# Patient Record
Sex: Female | Born: 1965 | Race: White | Hispanic: No | Smoking: Former smoker
Health system: Southern US, Community
[De-identification: ages and names within clinical notes are randomized; demographics above are authoritative.]

## PROBLEM LIST (undated history)

## (undated) DIAGNOSIS — F419 Anxiety disorder, unspecified: Secondary | ICD-10-CM

## (undated) DIAGNOSIS — M1990 Unspecified osteoarthritis, unspecified site: Secondary | ICD-10-CM

## (undated) DIAGNOSIS — Z8719 Personal history of other diseases of the digestive system: Secondary | ICD-10-CM

## (undated) DIAGNOSIS — K219 Gastro-esophageal reflux disease without esophagitis: Secondary | ICD-10-CM

## (undated) DIAGNOSIS — G4730 Sleep apnea, unspecified: Secondary | ICD-10-CM

## (undated) DIAGNOSIS — M797 Fibromyalgia: Secondary | ICD-10-CM

## (undated) HISTORY — PX: BREAST LUMPECTOMY: SHX2

## (undated) HISTORY — PX: TONSILLECTOMY: SUR1361

## (undated) HISTORY — PX: BILATERAL CARPAL TUNNEL RELEASE: SHX6508

## (undated) HISTORY — PX: SMALL INTESTINE SURGERY: SHX150

## (undated) HISTORY — PX: APPENDECTOMY: SHX54

---

## 2021-02-13 NOTE — Progress Notes (Signed)
DUE TO COVID-19 ONLY ONE VISITOR IS ALLOWED TO COME WITH YOU AND STAY IN THE WAITING ROOM ONLY DURING PRE OP AND PROCEDURE DAY OF SURGERY. THE 1 VISITOR  MAY VISIT WITH YOU AFTER SURGERY IN YOUR PRIVATE ROOM DURING VISITING HOURS ONLY!  YOU NEED TO HAVE A COVID 19 TEST ON__5/27/2022 _____ @_______ , THIS TEST MUST BE DONE BEFORE SURGERY,  COVID TESTING SITE [address] [place] , IT IS ON THE RIGHT GOING OUT [address] APPROXIMATELY  2 MINUTES PAST [employer] ON THE RIGHT. ONCE YOUR COVID TEST IS COMPLETED,  PLEASE BEGIN THE QUARANTINE INSTRUCTIONS AS OUTLINED IN YOUR HANDOUT.                Tabitha Robertson  02/13/2021   Your procedure is scheduled on:  02/26/21  Report to [facility] Main  Entrance   Report to admitting at    0610am      Call this number if you have problems the morning of surgery [phone]    REMEMBER: NO  SOLID FOOD CANDY OR GUM AFTER MIDNIGHT. CLEAR LIQUIDS UNTIL      0540am      . NOTHING BY MOUTH EXCEPT CLEAR LIQUIDS UNTIL      0540am   . PLEASE FINISH ENSURE DRINK PER SURGEON ORDER  WHICH NEEDS TO BE COMPLETED AT   0540am      .      CLEAR LIQUID DIET   Foods Allowed                                                                    Coffee and tea, regular and decaf                            Fruit ices (not with fruit pulp)                                      Iced Popsicles                                    Carbonated beverages, regular and diet                                    Cranberry, grape and apple juices Sports drinks like Gatorade Lightly seasoned clear broth or consume(fat free) Sugar, honey syrup ___________________________________________________________________      BRUSH YOUR TEETH MORNING OF SURGERY AND RINSE YOUR MOUTH OUT, NO CHEWING GUM CANDY OR MINTS.     Take these medicines the morning of surgery with A SIP OF WATER:       None  DO NOT TAKE ANY DIABETIC MEDICATIONS DAY OF YOUR  SURGERY                               You may not have any metal on your body including hair pins and  piercings  Do not wear jewelry, make-up, lotions, powders or perfumes, deodorant             Do not wear nail polish on your fingernails.  Do not shave  48 hours prior to surgery.              Men may shave face and neck.   Do not bring valuables to the hospital. [place].  Contacts, dentures or bridgework may not be worn into surgery.  Leave suitcase in the car. After surgery it may be brought to your room.     Patients discharged the day of surgery will not be allowed to drive home. IF YOU ARE HAVING SURGERY AND GOING HOME THE SAME DAY, YOU MUST HAVE AN ADULT TO DRIVE YOU HOME AND BE WITH YOU FOR 24 HOURS. YOU MAY GO HOME BY TAXI OR UBER OR ORTHERWISE, BUT AN ADULT MUST ACCOMPANY YOU HOME AND STAY WITH YOU FOR 24 HOURS.  Name and phone number of your driver:  Special Instructions: N/A              Please read over the following fact sheets you were given: _____________________________________________________________________  [facility] - Preparing for Surgery Before surgery, you can play an important role.  Because skin is not sterile, your skin needs to be as free of germs as possible.  You can reduce the number of germs on your skin by washing with CHG (chlorahexidine gluconate) soap before surgery.  CHG is an antiseptic cleaner which kills germs and bonds with the skin to continue killing germs even after washing. Please DO NOT use if you have an allergy to CHG or antibacterial soaps.  If your skin becomes reddened/irritated stop using the CHG and inform your nurse when you arrive at Short Stay. Do not shave (including legs and underarms) for at least 48 hours prior to the first CHG shower.  You may shave your face/neck. Please follow these instructions carefully:  1.  Shower with CHG Soap the night before surgery and the   morning of Surgery.  2.  If you choose to wash your hair, wash your hair first as usual with your  normal  shampoo.  3.  After you shampoo, rinse your hair and body thoroughly to remove the  shampoo.                           4.  Use CHG as you would any other liquid soap.  You can apply chg directly  to the skin and wash                       Gently with a scrungie or clean washcloth.  5.  Apply the CHG Soap to your body ONLY FROM THE NECK DOWN.   Do not use on face/ open                           Wound or open sores. Avoid contact with eyes, ears mouth and genitals (private parts).                       Wash face,  Genitals (private parts) with your normal soap.             6.  Wash thoroughly, paying special attention to the area where your surgery  will be performed.  7.  Thoroughly rinse your body with warm water from the neck down.  8.  DO NOT shower/wash with your normal soap after using and rinsing off  the CHG Soap.                9.  Pat yourself dry with a clean towel.            10.  Wear clean pajamas.            11.  Place clean sheets on your bed the night of your first shower and do not  sleep with pets. Day of Surgery : Do not apply any lotions/deodorants the morning of surgery.  Please wear clean clothes to the hospital/surgery center.  FAILURE TO FOLLOW THESE INSTRUCTIONS MAY RESULT IN THE CANCELLATION OF YOUR SURGERY PATIENT SIGNATURE_________________________________  NURSE SIGNATURE__________________________________  ________________________________________________________________________

## 2021-02-15 ENCOUNTER — Other Ambulatory Visit: Payer: Self-pay

## 2021-02-15 ENCOUNTER — Encounter (INDEPENDENT_AMBULATORY_CARE_PROVIDER_SITE_OTHER): Payer: Self-pay

## 2021-02-15 ENCOUNTER — Encounter (HOSPITAL_COMMUNITY)
Admission: RE | Admit: 2021-02-15 | Discharge: 2021-02-15 | Disposition: A | Discharge status: Home / Self Care | Payer: BC Managed Care – PPO | Source: Ambulatory Visit | Attending: Orthopedic Surgery | Admitting: Orthopedic Surgery

## 2021-02-15 ENCOUNTER — Encounter (HOSPITAL_COMMUNITY): Payer: Self-pay

## 2021-02-15 DIAGNOSIS — Z01812 Encounter for preprocedural laboratory examination: Secondary | ICD-10-CM | POA: Diagnosis not present

## 2021-02-15 HISTORY — DX: Sleep apnea, unspecified: G47.30

## 2021-02-15 HISTORY — DX: Anxiety disorder, unspecified: F41.9

## 2021-02-15 HISTORY — DX: Unspecified osteoarthritis, unspecified site: M19.90

## 2021-02-15 HISTORY — DX: Gastro-esophageal reflux disease without esophagitis: K21.9

## 2021-02-15 HISTORY — DX: Fibromyalgia: M79.7

## 2021-02-15 HISTORY — DX: Personal history of other diseases of the digestive system: Z87.19

## 2021-02-15 LAB — SURGICAL PCR SCREEN
MRSA, PCR: NEGATIVE
Staphylococcus aureus: NEGATIVE

## 2021-02-15 LAB — PROTIME-INR
INR: 0.9 (ref 0.8–1.2)
Prothrombin Time: 12.3 seconds (ref 11.4–15.2)

## 2021-02-15 LAB — COMPREHENSIVE METABOLIC PANEL
ALT: 14 U/L (ref 0–44)
AST: 18 U/L (ref 15–41)
Albumin: 4 g/dL (ref 3.5–5.0)
Alkaline Phosphatase: 178 U/L — ABNORMAL HIGH (ref 38–126)
Anion gap: 9 (ref 5–15)
BUN: 13 mg/dL (ref 6–20)
CO2: 23 mmol/L (ref 22–32)
Calcium: 8.7 mg/dL — ABNORMAL LOW (ref 8.9–10.3)
Chloride: 105 mmol/L (ref 98–111)
Creatinine, Ser: 0.87 mg/dL (ref 0.44–1.00)
GFR, Estimated: >60 mL/min (ref >60)
Glucose, Bld: 95 mg/dL (ref 70–99)
Potassium: 3.8 mmol/L (ref 3.5–5.1)
Sodium: 137 mmol/L (ref 135–145)
Total Bilirubin: 0.4 mg/dL (ref 0.3–1.2)
Total Protein: 7.2 g/dL (ref 6.5–8.1)

## 2021-02-15 LAB — TYPE AND SCREEN
ABO/RH(D): O NEG
Antibody Screen: NEGATIVE

## 2021-02-15 LAB — CBC
HCT: 39.8 % (ref 36.0–46.0)
Hemoglobin: 13.1 g/dL (ref 12.0–15.0)
MCH: 30.3 pg (ref 26.0–34.0)
MCHC: 32.9 g/dL (ref 30.0–36.0)
MCV: 91.9 fL (ref 80.0–100.0)
Platelets: 263 10*3/uL (ref 150–400)
RBC: 4.33 MIL/uL (ref 3.87–5.11)
RDW: 13.5 % (ref 11.5–15.5)
WBC: 6.5 10*3/uL (ref 4.0–10.5)
nRBC: 0 % (ref 0.0–0.2)

## 2021-02-15 LAB — APTT: aPTT: 35 seconds (ref 24–36)

## 2021-02-15 NOTE — Progress Notes (Addendum)
Anesthesia Review:  PCP: DR Imagene Sheller- They are to fax preop visit from 02/14/2021 and ekg.  02/14/2021 ov on chart  Cardiologist :none  Chest x-ray : EKG : 02/14/21 on chart  Echo : Stress test: Cardiac Cath :  Activity level: can do a flight of stairs without difficulty  Sleep Study/ CPAP :has not used in 10 years  Fasting Blood Sugar :      / Checks Blood Sugar -- times a day:   Blood Thinner/ Instructions /Last Dose: ASA / Instructions/ Last Dose :  CMp done 02/15/21 routed to Dr Charlann Boxer.

## 2021-02-15 NOTE — Progress Notes (Signed)
Vital signs done today at 1142 am were done in error.

## 2021-02-22 ENCOUNTER — Other Ambulatory Visit (HOSPITAL_COMMUNITY): Payer: BC Managed Care – PPO

## 2021-02-26 ENCOUNTER — Ambulatory Visit (HOSPITAL_COMMUNITY)
Admission: RE | Admit: 2021-02-26 | Payer: BC Managed Care – PPO | Source: Home / Self Care | Admitting: Orthopedic Surgery

## 2021-02-26 ENCOUNTER — Encounter (HOSPITAL_COMMUNITY): Admission: RE | Payer: Self-pay | Source: Home / Self Care

## 2021-02-26 SURGERY — ARTHROPLASTY, HIP, TOTAL, ANTERIOR APPROACH
Anesthesia: Spinal | Site: Hip | Laterality: Right

## 2021-03-28 NOTE — Progress Notes (Addendum)
PCP - Clearance Imagene Sheller 02-14-21 epic Cardiologist -   PPM/ICD -  Device Orders -  Rep Notified -   Chest x-ray -  EKG - on chart 02-14-21 Stress Test -  ECHO -  Cardiac Cath -   Sleep Study -  CPAP - no  Fasting Blood Sugar -  Checks Blood Sugar _____ times a day  Blood Thinner Instructions: Aspirin Instructions:  ERAS Protcol - PRE-SURGERY Ensure or G2-   COVID TEST- 7-8  Activity---Works around home without SOB Anesthesia review: osa no cpap  Patient denies shortness of breath, fever, cough and chest pain at PAT appointment   All instructions explained to the patient, with a verbal understanding of the material. Patient agrees to go over the instructions while at home for a better understanding. Patient also instructed to self quarantine after being tested for COVID-19. The opportunity to ask questions was provided.

## 2021-03-28 NOTE — Patient Instructions (Addendum)
DUE TO COVID-19 ONLY ONE VISITOR IS ALLOWED TO COME WITH YOU AND STAY IN THE WAITING ROOM ONLY DURING PRE OP AND PROCEDURE DAY OF SURGERY.   TWO VISITOR  MAY VISIT WITH YOU AFTER SURGERY IN YOUR PRIVATE ROOM DURING VISITING HOURS ONLY!  YOU NEED TO HAVE A COVID 19 TEST ON___7-8-22____ @_______ , THIS TEST MUST BE DONE BEFORE SURGERY,    COVID TESTING SITE [address] [place] ,   IT IS ON THE RIGHT GOING OUT [address] APPROXIMATELY  2 MINUTES PAST [employer] ON THE RIGHT. ONCE YOUR COVID TEST IS COMPLETED,  PLEASE Wear a mask when in public           Your procedure is scheduled on: 04-09-21   Report to [facility] Main  Entrance   Report to admitting at     0900 AM     Call this number if you have problems the morning of surgery [phone]    Remember: NO SOLID FOOD AFTER MIDNIGHT THE NIGHT PRIOR TO SURGERY. NOTHING BY MOUTH EXCEPT CLEAR LIQUIDS UNTIL 0830 am .   PLEASE FINISH ENSURE DRINK PER SURGEON ORDER  WHICH NEEDS TO BE COMPLETED AT        0830 am then nothing by mouth.      CLEAR LIQUID DIET   Foods Allowed                                                                     Foods Excluded water Black Coffee and tea, regular and decaf                             liquids that you cannot  Plain Jell-O any favor except red or purple                                           see through such as: Fruit ices (not with fruit pulp)                                                   milk, soups, orange juice  Iced Popsicles                                                         All solid food Carbonated beverages, regular and diet                                    Cranberry, grape and apple juices Sports drinks like Gatorade Lightly seasoned clear broth or consume(fat free) Sugar, honey syrup  _____________________________________________________________________     BRUSH YOUR TEETH MORNING OF SURGERY AND RINSE YOUR MOUTH OUT,  NO CHEWING GUM CANDY  OR MINTS.     Take these medicines the morning of surgery with A SIP OF WATER: none  DO NOT TAKE ANY DIABETIC MEDICATIONS DAY OF YOUR SURGERY                               You may not have any metal on your body including hair pins and              piercings  Do not wear jewelry, make-up, lotions, powders or perfumes, deodorant             Do not wear nail polish on your fingernails or toenails .  Do not shave  48 hours prior to surgery.         Do not bring valuables to the hospital. [facility] IS NOT             RESPONSIBLE   FOR VALUABLES.  Contacts, dentures or bridgework may not be worn into surgery.       Patients discharged the day of surgery will not be allowed to drive home. IF YOU ARE HAVING SURGERY AND GOING HOME THE SAME DAY, YOU MUST HAVE AN ADULT TO DRIVE YOU HOME AND BE WITH YOU FOR 24 HOURS. YOU MAY GO HOME BY TAXI OR UBER OR ORTHERWISE, BUT AN ADULT MUST ACCOMPANY YOU HOME AND STAY WITH YOU FOR 24 HOURS.  Name and phone number of your driver:  Special Instructions: N/A              Please read over the following fact sheets you were given: _____________________________________________________________________             [facility] - Preparing for Surgery Before surgery, you can play an important role.  Because skin is not sterile, your skin needs to be as free of germs as possible.  You can reduce the number of germs on your skin by washing with CHG (chlorahexidine gluconate) soap before surgery.  CHG is an antiseptic cleaner which kills germs and bonds with the skin to continue killing germs even after washing. Please DO NOT use if you have an allergy to CHG or antibacterial soaps.  If your skin becomes reddened/irritated stop using the CHG and inform your nurse when you arrive at Short Stay. Do not shave (including legs and underarms) for at least 48 hours prior to the first CHG shower.  You may shave your face/neck. Please follow these  instructions carefully:  1.  Shower with CHG Soap the night before surgery and the  morning of Surgery.  2.  If you choose to wash your hair, wash your hair first as usual with your  normal  shampoo.  3.  After you shampoo, rinse your hair and body thoroughly to remove the  shampoo.                           4.  Use CHG as you would any other liquid soap.  You can apply chg directly  to the skin and wash                       Gently with a scrungie or clean washcloth.  5.  Apply the CHG Soap to your body ONLY FROM THE NECK DOWN.   Do not use on face/ open  Wound or open sores. Avoid contact with eyes, ears mouth and genitals (private parts).                       Wash face,  Genitals (private parts) with your normal soap.             6.  Wash thoroughly, paying special attention to the area where your surgery  will be performed.  7.  Thoroughly rinse your body with warm water from the neck down.  8.  DO NOT shower/wash with your normal soap after using and rinsing off  the CHG Soap.                9.  Pat yourself dry with a clean towel.            10.  Wear clean pajamas.            11.  Place clean sheets on your bed the night of your first shower and do not  sleep with pets. Day of Surgery : Do not apply any lotions/deodorants the morning of surgery.  Please wear clean clothes to the hospital/surgery center.  FAILURE TO FOLLOW THESE INSTRUCTIONS MAY RESULT IN THE CANCELLATION OF YOUR SURGERY PATIENT SIGNATURE_________________________________  NURSE SIGNATURE__________________________________   Incentive Spirometer  An incentive spirometer is a tool that can help keep your lungs clear and active. This tool measures how well you are filling your lungs with each breath. Taking long deep breaths may help reverse or decrease the chance of developing breathing (pulmonary) problems (especially infection) following: A long period of time when you are unable to move or be  active. BEFORE THE PROCEDURE  If the spirometer includes an indicator to show your best effort, your nurse or respiratory therapist will set it to a desired goal. If possible, sit up straight or lean slightly forward. Try not to slouch. Hold the incentive spirometer in an upright position. INSTRUCTIONS FOR USE  Sit on the edge of your bed if possible, or sit up as far as you can in bed or on a chair. Hold the incentive spirometer in an upright position. Breathe out normally. Place the mouthpiece in your mouth and seal your lips tightly around it. Breathe in slowly and as deeply as possible, raising the piston or the ball toward the top of the column. Hold your breath for 3-5 seconds or for as long as possible. Allow the piston or ball to fall to the bottom of the column. Remove the mouthpiece from your mouth and breathe out normally. Rest for a few seconds and repeat Steps 1 through 7 at least 10 times every 1-2 hours when you are awake. Take your time and take a few normal breaths between deep breaths. The spirometer may include an indicator to show your best effort. Use the indicator as a goal to work toward during each repetition. After each set of 10 deep breaths, practice coughing to be sure your lungs are clear. If you have an incision (the cut made at the time of surgery), support your incision when coughing by placing a pillow or rolled up towels firmly against it. Once you are able to get out of bed, walk around indoors and cough well. You may stop using the incentive spirometer when instructed by your caregiver.  RISKS AND COMPLICATIONS Take your time so you do not get dizzy or light-headed. If you are in pain, you may need to take or ask for pain  medication before doing incentive spirometry. It is harder to take a deep breath if you are having pain. AFTER USE Rest and breathe slowly and easily. It can be helpful to keep track of a log of your progress. Your caregiver can provide you  with a simple table to help with this. If you are using the spirometer at home, follow these instructions: [place] IF:  You are having difficultly using the spirometer. You have trouble using the spirometer as often as instructed. Your pain medication is not giving enough relief while using the spirometer. You develop fever of 100.5 F (38.1 C) or higher. SEEK IMMEDIATE MEDICAL CARE IF:  You cough up bloody sputum that had not been present before. You develop fever of 102 F (38.9 C) or greater. You develop worsening pain at or near the incision site. MAKE SURE YOU:  Understand these instructions. Will watch your condition. Will get help right away if you are not doing well or get worse. Document Released: 01/26/2007 Document Revised: 12/08/2011 Document Reviewed: 03/29/2007 [facility] Patient Information 2014 [employer], [place].   ________________________________________________________________________  ________________________________________________________________________

## 2021-03-29 ENCOUNTER — Encounter (HOSPITAL_COMMUNITY)
Admission: RE | Admit: 2021-03-29 | Discharge: 2021-03-29 | Disposition: A | Discharge status: Home / Self Care | Payer: BC Managed Care – PPO | Source: Ambulatory Visit | Attending: Orthopedic Surgery | Admitting: Orthopedic Surgery

## 2021-03-29 ENCOUNTER — Other Ambulatory Visit: Payer: Self-pay

## 2021-03-29 ENCOUNTER — Encounter (HOSPITAL_COMMUNITY): Payer: Self-pay

## 2021-03-29 DIAGNOSIS — Z01812 Encounter for preprocedural laboratory examination: Secondary | ICD-10-CM | POA: Insufficient documentation

## 2021-03-29 LAB — COMPREHENSIVE METABOLIC PANEL
ALT: 13 U/L (ref 0–44)
AST: 18 U/L (ref 15–41)
Albumin: 3.8 g/dL (ref 3.5–5.0)
Alkaline Phosphatase: 169 U/L — ABNORMAL HIGH (ref 38–126)
Anion gap: 6 (ref 5–15)
BUN: 11 mg/dL (ref 6–20)
CO2: 25 mmol/L (ref 22–32)
Calcium: 8.9 mg/dL (ref 8.9–10.3)
Chloride: 105 mmol/L (ref 98–111)
Creatinine, Ser: 0.76 mg/dL (ref 0.44–1.00)
GFR, Estimated: >60 mL/min (ref >60)
Glucose, Bld: 99 mg/dL (ref 70–99)
Potassium: 4.1 mmol/L (ref 3.5–5.1)
Sodium: 136 mmol/L (ref 135–145)
Total Bilirubin: 0.5 mg/dL (ref 0.3–1.2)
Total Protein: 7.1 g/dL (ref 6.5–8.1)

## 2021-03-29 LAB — CBC
HCT: 38.5 % (ref 36.0–46.0)
Hemoglobin: 12.3 g/dL (ref 12.0–15.0)
MCH: 29.2 pg (ref 26.0–34.0)
MCHC: 31.9 g/dL (ref 30.0–36.0)
MCV: 91.4 fL (ref 80.0–100.0)
Platelets: 282 10*3/uL (ref 150–400)
RBC: 4.21 MIL/uL (ref 3.87–5.11)
RDW: 14.2 % (ref 11.5–15.5)
WBC: 8.9 10*3/uL (ref 4.0–10.5)
nRBC: 0 % (ref 0.0–0.2)

## 2021-03-29 LAB — PROTIME-INR
INR: 0.9 (ref 0.8–1.2)
Prothrombin Time: 12.2 seconds (ref 11.4–15.2)

## 2021-03-29 LAB — TYPE AND SCREEN
ABO/RH(D): O NEG
Antibody Screen: NEGATIVE

## 2021-03-29 LAB — APTT: aPTT: 34 seconds (ref 24–36)

## 2021-03-30 LAB — SURGICAL PCR SCREEN
MRSA, PCR: NEGATIVE
Staphylococcus aureus: POSITIVE — AB

## 2021-04-02 NOTE — H&P (Signed)
TOTAL HIP ADMISSION H&P  Patient is admitted for right total hip arthroplasty.  Subjective:  Chief Complaint: right hip pain  HPI: Tabitha Robertson, 55 y.o. female, has a history of pain and functional disability in the right hip(s) due to arthritis and patient has failed non-surgical conservative treatments for greater than 12 weeks to include NSAID's and/or analgesics, corticosteriod injections, and activity modification.  Onset of symptoms was gradual starting 3 years ago with gradually worsening course since that time.The patient noted no past surgery on the right hip(s).  Patient currently rates pain in the right hip at 8 out of 10 with activity. Patient has worsening of pain with activity and weight bearing and pain that interfers with activities of daily living. Patient has evidence of joint space narrowing by imaging studies. This condition presents safety issues increasing the risk of falls. There is no current active infection.  There are no problems to display for this patient.  Past Medical History:  Diagnosis Date   Anxiety    Arthritis    Fibromyalgia    GERD (gastroesophageal reflux disease)    History of hiatal hernia    Sleep apnea    does not use cpap not used in 10 years     Past Surgical History:  Procedure Laterality Date   APPENDECTOMY     BILATERAL CARPAL TUNNEL RELEASE     BREAST LUMPECTOMY     CESAREAN SECTION     x 3   SMALL INTESTINE SURGERY     TONSILLECTOMY     and adenoidectomy     No current facility-administered medications for this encounter.   Current Outpatient Medications  Medication Sig Dispense Refill Last Dose   acetaminophen (TYLENOL) 500 MG tablet Take 1,000 mg by mouth 2 (two) times daily as needed for moderate pain.      ALPHA LIPOIC ACID PO Take 1 capsule by mouth daily.      amitriptyline (ELAVIL) 50 MG tablet Take 50 mg by mouth daily.      Ascorbic Acid (VITAMIN C PO) Take 1 tablet by mouth daily.      b complex vitamins  capsule Take 1 capsule by mouth daily.      Choline Bitartrate (CHOLINE PO) Take 1 tablet by mouth 3 (three) times daily with meals.      Cyanocobalamin (B-12 PO) Take 1 capsule by mouth daily.      diclofenac Sodium (VOLTAREN) 1 % GEL Apply 1 application topically 4 (four) times daily as needed (pain).      ibuprofen (ADVIL) 200 MG tablet Take 400 mg by mouth 2 (two) times daily as needed for moderate pain or headache.      LUTEIN PO Take 1 capsule by mouth daily.      MAGNESIUM PO Take 1 tablet by mouth daily.      methocarbamol (ROBAXIN) 500 MG tablet Take 500 mg by mouth 3 (three) times daily.      Multiple Vitamins-Minerals (ZINC PO) Take 1 tablet by mouth daily.      polyethylene glycol (MIRALAX / GLYCOLAX) 17 g packet Take 17 g by mouth daily.      POTASSIUM PO Take 1 tablet by mouth daily.      TURMERIC PO Take 1 tablet by mouth daily.      VITAMIN A PO Take 1 capsule by mouth daily.      VITAMIN D PO Take 3 capsules by mouth daily.      VITAMIN E PO Take 1  capsule by mouth daily.      traMADol (ULTRAM) 50 MG tablet Take 100 mg by mouth daily as needed.      No Known Allergies  Social History   Tobacco Use   Smoking status: Former    Pack years: 0.00    Types: Cigarettes    Quit date: 09/29/2017    Years since quitting: 3.5   Smokeless tobacco: Never  Substance Use Topics   Alcohol use: Not Currently    Comment: rare    No family history on file.   Review of Systems  Constitutional:  Negative for chills and fever.  Respiratory:  Negative for cough and shortness of breath.   Cardiovascular:  Negative for chest pain.  Gastrointestinal:  Negative for nausea and vomiting.  Musculoskeletal:  Positive for arthralgias.    Objective:  Physical Exam Well nourished and well developed. General: Alert and oriented x3, cooperative and pleasant, no acute distress. Head: normocephalic, atraumatic, neck supple. Eyes: EOMI.  Musculoskeletal: Right hip exam: Painful and very  limited right hip range of motion today. She has pain with active hip flexion with external rotation noted. Passive range of motion is pain-free with hip flexion over 90 degrees with internal rotation to less than 5 degrees before pelvic tilting and pain. She is neurovascular intact distally without lower extremity edema or erythema  Calves soft and nontender. Motor function intact in LE. Strength 5/5 LE bilaterally. Neuro: Distal pulses 2+. Sensation to light touch intact in LE.  Vital signs in last 24 hours:    Labs:   Estimated body mass index is 35.67 kg/m as calculated from the following:   Height as of 03/29/21: 5\' 6"  (1.676 m).   Weight as of 03/29/21: 100.2 kg.   Imaging Review Plain radiographs demonstrate severe degenerative joint disease of the right hip(s). The bone quality appears to be adequate for age and reported activity level.    Assessment/Plan:  End stage arthritis, right hip(s)  The patient history, physical examination, clinical judgement of the provider and imaging studies are consistent with end stage degenerative joint disease of the right hip(s) and total hip arthroplasty is deemed medically necessary. The treatment options including medical management, injection therapy, arthroscopy and arthroplasty were discussed at length. The risks and benefits of total hip arthroplasty were presented and reviewed. The risks due to aseptic loosening, infection, stiffness, dislocation/subluxation,  thromboembolic complications and other imponderables were discussed.  The patient acknowledged the explanation, agreed to proceed with the plan and consent was signed. Patient is being admitted for inpatient treatment for surgery, pain control, PT, OT, prophylactic antibiotics, VTE prophylaxis, progressive ambulation and ADL's and discharge planning.The patient is planning to be discharged  home.  Therapy Plans: HEP Disposition: Home with mother & stepdad Planned DVT Prophylaxis:  aspirin 81mg  BID DME needed: walker PCP: Dr. 05/30/21, clearance received TXA: IV Allergies: NKDA Anesthesia Concerns: none BMI: 37.4 Last HgbA1c: Not diabetic.  Other: - Celebrex, tylenol, Norco, Robaxin   , PA-C Orthopedic Surgery [facility] [place] [phone]

## 2021-04-04 ENCOUNTER — Other Ambulatory Visit (HOSPITAL_COMMUNITY): Payer: BC Managed Care – PPO

## 2021-04-05 ENCOUNTER — Other Ambulatory Visit (HOSPITAL_COMMUNITY)
Admission: RE | Admit: 2021-04-05 | Discharge: 2021-04-05 | Disposition: A | Discharge status: Home / Self Care | Payer: BC Managed Care – PPO | Source: Ambulatory Visit | Attending: Orthopedic Surgery | Admitting: Orthopedic Surgery

## 2021-04-05 DIAGNOSIS — Z20822 Contact with and (suspected) exposure to covid-19: Secondary | ICD-10-CM | POA: Diagnosis not present

## 2021-04-05 DIAGNOSIS — Z01812 Encounter for preprocedural laboratory examination: Secondary | ICD-10-CM | POA: Diagnosis not present

## 2021-04-05 LAB — SARS CORONAVIRUS 2 (TAT 6-24 HRS): SARS Coronavirus 2: NEGATIVE

## 2021-04-09 ENCOUNTER — Encounter (HOSPITAL_COMMUNITY)
Admission: RE | Disposition: A | Discharge status: Home / Self Care | Payer: Self-pay | Source: Home / Self Care | Attending: Orthopedic Surgery

## 2021-04-09 ENCOUNTER — Ambulatory Visit (HOSPITAL_COMMUNITY): Payer: BC Managed Care – PPO

## 2021-04-09 ENCOUNTER — Observation Stay (HOSPITAL_COMMUNITY): Payer: BC Managed Care – PPO

## 2021-04-09 ENCOUNTER — Ambulatory Visit (HOSPITAL_COMMUNITY): Payer: BC Managed Care – PPO | Admitting: Anesthesiology

## 2021-04-09 ENCOUNTER — Other Ambulatory Visit: Payer: Self-pay

## 2021-04-09 ENCOUNTER — Encounter (HOSPITAL_COMMUNITY): Payer: Self-pay | Admitting: Orthopedic Surgery

## 2021-04-09 ENCOUNTER — Observation Stay (HOSPITAL_COMMUNITY)
Admission: RE | Admit: 2021-04-09 | Discharge: 2021-04-10 | Disposition: A | Discharge status: Home / Self Care | Payer: BC Managed Care – PPO | Attending: Orthopedic Surgery | Admitting: Orthopedic Surgery

## 2021-04-09 DIAGNOSIS — Z87891 Personal history of nicotine dependence: Secondary | ICD-10-CM | POA: Diagnosis not present

## 2021-04-09 DIAGNOSIS — M1611 Unilateral primary osteoarthritis, right hip: Principal | ICD-10-CM | POA: Insufficient documentation

## 2021-04-09 DIAGNOSIS — Z96649 Presence of unspecified artificial hip joint: Secondary | ICD-10-CM

## 2021-04-09 DIAGNOSIS — Z419 Encounter for procedure for purposes other than remedying health state, unspecified: Secondary | ICD-10-CM

## 2021-04-09 HISTORY — PX: TOTAL HIP ARTHROPLASTY: SHX124

## 2021-04-09 SURGERY — ARTHROPLASTY, HIP, TOTAL, ANTERIOR APPROACH
Anesthesia: Spinal | Site: Hip | Laterality: Right

## 2021-04-09 MED ORDER — BUPIVACAINE IN DEXTROSE 0.75-8.25 % IT SOLN
INTRATHECAL | Status: DC | PRN
  Administered 2021-04-09: 1.6 mL via INTRATHECAL

## 2021-04-09 MED ORDER — METHOCARBAMOL 500 MG IVPB - SIMPLE MED
INTRAVENOUS | Status: AC
  Filled 2021-04-09: qty 50

## 2021-04-09 MED ORDER — DEXAMETHASONE SODIUM PHOSPHATE 10 MG/ML IJ SOLN
8.0000 mg | Freq: Once | INTRAMUSCULAR | Status: AC
  Administered 2021-04-09: 8 mg via INTRAVENOUS

## 2021-04-09 MED ORDER — SODIUM CHLORIDE 0.9 % IV SOLN: 2.0000 g | INTRAVENOUS | Status: DC

## 2021-04-09 MED ORDER — CELECOXIB 200 MG PO CAPS
200.0000 mg | ORAL_CAPSULE | Freq: Two times a day (BID) | ORAL | Status: DC
  Administered 2021-04-09 – 2021-04-10 (×2): 200 mg via ORAL
  Filled 2021-04-09 (×2): qty 1

## 2021-04-09 MED ORDER — SODIUM CHLORIDE 0.9 % IV SOLN
2.0000 g | INTRAVENOUS | Status: AC
  Administered 2021-04-09: 2 g via INTRAVENOUS
  Filled 2021-04-09: qty 2

## 2021-04-09 MED ORDER — PROMETHAZINE HCL 25 MG/ML IJ SOLN: 6.2500 mg | INTRAMUSCULAR | Status: DC | PRN

## 2021-04-09 MED ORDER — OXYCODONE HCL 5 MG/5ML PO SOLN: 5.0000 mg | Freq: Once | ORAL | Status: DC | PRN

## 2021-04-09 MED ORDER — AMITRIPTYLINE HCL 50 MG PO TABS: 50.0000 mg | ORAL_TABLET | Freq: Every day | ORAL | Status: DC

## 2021-04-09 MED ORDER — CHLORHEXIDINE GLUCONATE 0.12 % MT SOLN
15.0000 mL | Freq: Once | OROMUCOSAL | Status: AC
  Administered 2021-04-09: 15 mL via OROMUCOSAL

## 2021-04-09 MED ORDER — SODIUM CHLORIDE 0.9 % IR SOLN
Status: DC | PRN
  Administered 2021-04-09 (×3): 120 mL

## 2021-04-09 MED ORDER — SODIUM CHLORIDE 0.9 % IV SOLN
2.0000 g | Freq: Four times a day (QID) | INTRAVENOUS | Status: AC
  Administered 2021-04-09 (×2): 2 g via INTRAVENOUS
  Filled 2021-04-09 (×2): qty 2

## 2021-04-09 MED ORDER — ONDANSETRON HCL 4 MG/2ML IJ SOLN
INTRAMUSCULAR | Status: DC | PRN
  Administered 2021-04-09: 4 mg via INTRAVENOUS

## 2021-04-09 MED ORDER — ORAL CARE MOUTH RINSE: 15.0000 mL | Freq: Once | OROMUCOSAL | Status: AC

## 2021-04-09 MED ORDER — FENTANYL CITRATE (PF) 100 MCG/2ML IJ SOLN
25.0000 ug | INTRAMUSCULAR | Status: DC | PRN
  Administered 2021-04-09 (×3): 50 ug via INTRAVENOUS

## 2021-04-09 MED ORDER — POLYETHYLENE GLYCOL 3350 17 G PO PACK: 17.0000 g | PACK | Freq: Every day | ORAL | Status: DC | PRN

## 2021-04-09 MED ORDER — OXYCODONE HCL 5 MG PO TABS: 5.0000 mg | ORAL_TABLET | Freq: Once | ORAL | Status: DC | PRN

## 2021-04-09 MED ORDER — HYDROMORPHONE HCL 1 MG/ML IJ SOLN
INTRAMUSCULAR | Status: AC
  Filled 2021-04-09: qty 1

## 2021-04-09 MED ORDER — HYDROCODONE-ACETAMINOPHEN 7.5-325 MG PO TABS
1.0000 | ORAL_TABLET | ORAL | Status: DC | PRN
  Administered 2021-04-10 (×2): 2 via ORAL
  Filled 2021-04-09 (×2): qty 2

## 2021-04-09 MED ORDER — ONDANSETRON HCL 4 MG PO TABS: 4.0000 mg | ORAL_TABLET | Freq: Four times a day (QID) | ORAL | Status: DC | PRN

## 2021-04-09 MED ORDER — CHLORHEXIDINE GLUCONATE CLOTH 2 % EX PADS: 6.0000 | MEDICATED_PAD | Freq: Every day | CUTANEOUS | Status: DC

## 2021-04-09 MED ORDER — DIPHENHYDRAMINE HCL 12.5 MG/5ML PO ELIX: 12.5000 mg | ORAL_SOLUTION | ORAL | Status: DC | PRN

## 2021-04-09 MED ORDER — METHOCARBAMOL 500 MG IVPB - SIMPLE MED
500.0000 mg | Freq: Four times a day (QID) | INTRAVENOUS | Status: DC | PRN
  Administered 2021-04-09: 500 mg via INTRAVENOUS
  Filled 2021-04-09: qty 50

## 2021-04-09 MED ORDER — METHOCARBAMOL 500 MG PO TABS
500.0000 mg | ORAL_TABLET | Freq: Four times a day (QID) | ORAL | Status: DC | PRN
  Administered 2021-04-10: 500 mg via ORAL
  Filled 2021-04-09: qty 1

## 2021-04-09 MED ORDER — SODIUM CHLORIDE 0.9 % IV SOLN: INTRAVENOUS | Status: DC

## 2021-04-09 MED ORDER — ONDANSETRON HCL 4 MG/2ML IJ SOLN
INTRAMUSCULAR | Status: AC
  Filled 2021-04-09: qty 2

## 2021-04-09 MED ORDER — PHENOL 1.4 % MT LIQD: 1.0000 | OROMUCOSAL | Status: DC | PRN

## 2021-04-09 MED ORDER — HYDROMORPHONE HCL 1 MG/ML IJ SOLN
0.2500 mg | INTRAMUSCULAR | Status: DC | PRN
  Administered 2021-04-09 (×2): 0.5 mg via INTRAVENOUS

## 2021-04-09 MED ORDER — TRANEXAMIC ACID-NACL 1000-0.7 MG/100ML-% IV SOLN
1000.0000 mg | Freq: Once | INTRAVENOUS | Status: AC
  Administered 2021-04-09: 1000 mg via INTRAVENOUS
  Filled 2021-04-09: qty 100

## 2021-04-09 MED ORDER — ACETAMINOPHEN 10 MG/ML IV SOLN
INTRAVENOUS | Status: AC
  Filled 2021-04-09: qty 100

## 2021-04-09 MED ORDER — FENTANYL CITRATE (PF) 100 MCG/2ML IJ SOLN
INTRAMUSCULAR | Status: AC
  Filled 2021-04-09: qty 2

## 2021-04-09 MED ORDER — PROPOFOL 500 MG/50ML IV EMUL
INTRAVENOUS | Status: DC | PRN
  Administered 2021-04-09: 75 ug/kg/min via INTRAVENOUS

## 2021-04-09 MED ORDER — ASPIRIN 81 MG PO CHEW
81.0000 mg | CHEWABLE_TABLET | Freq: Two times a day (BID) | ORAL | Status: DC
  Administered 2021-04-09 – 2021-04-10 (×2): 81 mg via ORAL
  Filled 2021-04-09 (×2): qty 1

## 2021-04-09 MED ORDER — FENTANYL CITRATE (PF) 100 MCG/2ML IJ SOLN
INTRAMUSCULAR | Status: DC | PRN
  Administered 2021-04-09: 50 ug via INTRAVENOUS

## 2021-04-09 MED ORDER — ACETAMINOPHEN 325 MG PO TABS
325.0000 mg | ORAL_TABLET | Freq: Four times a day (QID) | ORAL | Status: DC | PRN
Start: 2021-04-10 — End: 2021-04-10

## 2021-04-09 MED ORDER — HYDROCODONE-ACETAMINOPHEN 5-325 MG PO TABS
1.0000 | ORAL_TABLET | ORAL | Status: DC | PRN
  Administered 2021-04-09 – 2021-04-10 (×3): 2 via ORAL
  Filled 2021-04-09 (×3): qty 2

## 2021-04-09 MED ORDER — ACETAMINOPHEN 10 MG/ML IV SOLN
1000.0000 mg | Freq: Once | INTRAVENOUS | Status: AC
  Administered 2021-04-09: 1000 mg via INTRAVENOUS

## 2021-04-09 MED ORDER — BISACODYL 10 MG RE SUPP: 10.0000 mg | Freq: Every day | RECTAL | Status: DC | PRN

## 2021-04-09 MED ORDER — METOCLOPRAMIDE HCL 5 MG/ML IJ SOLN: 5.0000 mg | Freq: Three times a day (TID) | INTRAMUSCULAR | Status: DC | PRN

## 2021-04-09 MED ORDER — DOCUSATE SODIUM 100 MG PO CAPS
100.0000 mg | ORAL_CAPSULE | Freq: Two times a day (BID) | ORAL | Status: DC
  Administered 2021-04-09 – 2021-04-10 (×2): 100 mg via ORAL
  Filled 2021-04-09 (×2): qty 1

## 2021-04-09 MED ORDER — MIDAZOLAM HCL 5 MG/5ML IJ SOLN
INTRAMUSCULAR | Status: DC | PRN
  Administered 2021-04-09 (×2): 1 mg via INTRAVENOUS

## 2021-04-09 MED ORDER — PROPOFOL 1000 MG/100ML IV EMUL
INTRAVENOUS | Status: AC
  Filled 2021-04-09: qty 100

## 2021-04-09 MED ORDER — ONDANSETRON HCL 4 MG/2ML IJ SOLN: 4.0000 mg | Freq: Four times a day (QID) | INTRAMUSCULAR | Status: DC | PRN

## 2021-04-09 MED ORDER — LACTATED RINGERS IV SOLN: INTRAVENOUS | Status: DC

## 2021-04-09 MED ORDER — METOCLOPRAMIDE HCL 5 MG PO TABS: 5.0000 mg | ORAL_TABLET | Freq: Three times a day (TID) | ORAL | Status: DC | PRN

## 2021-04-09 MED ORDER — TRANEXAMIC ACID-NACL 1000-0.7 MG/100ML-% IV SOLN
1000.0000 mg | INTRAVENOUS | Status: AC
  Administered 2021-04-09: 1000 mg via INTRAVENOUS
  Filled 2021-04-09: qty 100

## 2021-04-09 MED ORDER — DEXAMETHASONE SODIUM PHOSPHATE 10 MG/ML IJ SOLN
INTRAMUSCULAR | Status: AC
  Filled 2021-04-09: qty 1

## 2021-04-09 MED ORDER — PHENYLEPHRINE HCL-NACL 10-0.9 MG/250ML-% IV SOLN
INTRAVENOUS | Status: DC | PRN
  Administered 2021-04-09: 25 ug/min via INTRAVENOUS

## 2021-04-09 MED ORDER — MORPHINE SULFATE (PF) 4 MG/ML IV SOLN: 0.5000 mg | INTRAVENOUS | Status: DC | PRN

## 2021-04-09 MED ORDER — PROPOFOL 10 MG/ML IV BOLUS
INTRAVENOUS | Status: DC | PRN
  Administered 2021-04-09: 20 mg via INTRAVENOUS
  Administered 2021-04-09: 30 mg via INTRAVENOUS

## 2021-04-09 MED ORDER — MIDAZOLAM HCL 2 MG/2ML IJ SOLN
INTRAMUSCULAR | Status: AC
  Filled 2021-04-09: qty 2

## 2021-04-09 MED ORDER — ALBUMIN HUMAN 5 % IV SOLN: INTRAVENOUS | Status: DC | PRN

## 2021-04-09 MED ORDER — DEXAMETHASONE SODIUM PHOSPHATE 10 MG/ML IJ SOLN
10.0000 mg | Freq: Once | INTRAMUSCULAR | Status: AC
  Administered 2021-04-10: 10 mg via INTRAVENOUS
  Filled 2021-04-09: qty 1

## 2021-04-09 MED ORDER — POVIDONE-IODINE 10 % EX SWAB
2.0000 "application " | Freq: Once | CUTANEOUS | Status: AC
  Administered 2021-04-09: 2 via TOPICAL

## 2021-04-09 MED ORDER — PROPOFOL 10 MG/ML IV BOLUS
INTRAVENOUS | Status: AC
  Filled 2021-04-09: qty 20

## 2021-04-09 MED ORDER — MENTHOL 3 MG MT LOZG: 1.0000 | LOZENGE | OROMUCOSAL | Status: DC | PRN

## 2021-04-09 SURGICAL SUPPLY — 42 items
BAG COUNTER SPONGE SURGICOUNT (BAG) IMPLANT
BAG DECANTER FOR FLEXI CONT (MISCELLANEOUS) IMPLANT
BAG ZIPLOCK 12X15 (MISCELLANEOUS) IMPLANT
BLADE SAG 18X100X1.27 (BLADE) ×2 IMPLANT
COVER PERINEAL POST (MISCELLANEOUS) ×2 IMPLANT
COVER SURGICAL LIGHT HANDLE (MISCELLANEOUS) ×2 IMPLANT
CUP ACET PINNACLE SECTR 50MM (Hips) ×1 IMPLANT
DERMABOND ADVANCED (GAUZE/BANDAGES/DRESSINGS) ×1
DERMABOND ADVANCED .7 DNX12 (GAUZE/BANDAGES/DRESSINGS) ×1 IMPLANT
DRAPE FOOT SWITCH (DRAPES) ×2 IMPLANT
DRAPE STERI IOBAN 125X83 (DRAPES) ×2 IMPLANT
DRAPE U-SHAPE 47X51 STRL (DRAPES) ×4 IMPLANT
DRESSING AQUACEL AG SP 3.5X10 (GAUZE/BANDAGES/DRESSINGS) ×1 IMPLANT
DRSG AQUACEL AG SP 3.5X10 (GAUZE/BANDAGES/DRESSINGS) ×2
DURAPREP 26ML APPLICATOR (WOUND CARE) ×2 IMPLANT
ELECT REM PT RETURN 15FT ADLT (MISCELLANEOUS) ×2 IMPLANT
ELIMINATOR HOLE APEX DEPUY (Hips) ×2 IMPLANT
GLOVE SURG ENC MOIS LTX SZ6 (GLOVE) ×4 IMPLANT
GLOVE SURG UNDER LTX SZ7.5 (GLOVE) ×2 IMPLANT
GLOVE SURG UNDER POLY LF SZ6.5 (GLOVE) ×2 IMPLANT
GLOVE SURG UNDER POLY LF SZ7.5 (GLOVE) ×4 IMPLANT
GOWN STRL REUS W/TWL LRG LVL3 (GOWN DISPOSABLE) ×4 IMPLANT
HEAD FEMORAL 32 CERAMIC (Hips) ×2 IMPLANT
HOLDER FOLEY CATH W/STRAP (MISCELLANEOUS) ×2 IMPLANT
KIT TURNOVER KIT A (KITS) ×2 IMPLANT
LINER ACET PNNCL PLUS4 NEUTRAL (Hips) ×1 IMPLANT
PACK ANTERIOR HIP CUSTOM (KITS) ×2 IMPLANT
PENCIL SMOKE EVACUATOR (MISCELLANEOUS) IMPLANT
PINNACLE PLUS 4 NEUTRAL (Hips) ×2 IMPLANT
PINNACLE SECTOR CUP 50MM (Hips) ×2 IMPLANT
SCREW 6.5MMX30MM (Screw) ×2 IMPLANT
STEM FEMORAL SZ5 HIGH ACTIS (Stem) ×2 IMPLANT
SUT MNCRL AB 4-0 PS2 18 (SUTURE) ×4 IMPLANT
SUT STRATAFIX 0 PDS 27 VIOLET (SUTURE) ×2
SUT VIC AB 1 CT1 36 (SUTURE) ×6 IMPLANT
SUT VIC AB 2-0 CT1 27 (SUTURE) ×2
SUT VIC AB 2-0 CT1 TAPERPNT 27 (SUTURE) ×2 IMPLANT
SUTURE STRATFX 0 PDS 27 VIOLET (SUTURE) ×1 IMPLANT
TRAY FOLEY MTR SLVR 16FR STAT (SET/KITS/TRAYS/PACK) IMPLANT
TUBE SUCTION HIGH CAP CLEAR NV (SUCTIONS) ×2 IMPLANT
WATER STERILE IRR 1000ML POUR (IV SOLUTION) ×4 IMPLANT
YANKAUER SUCT BULB TIP NO VENT (SUCTIONS) ×2 IMPLANT

## 2021-04-09 NOTE — Anesthesia Procedure Notes (Signed)
Spinal  Patient location during procedure: OR Start time: 04/09/2021 11:04 AM End time: 04/09/2021 11:09 AM Reason for block: surgical anesthesia Staffing Performed: anesthesiologist  Anesthesiologist: [name], MD Preanesthetic Checklist Completed: patient identified, IV checked, risks and benefits discussed, surgical consent, monitors and equipment checked, pre-op evaluation and timeout performed Spinal Block Patient position: sitting Prep: DuraPrep Patient monitoring: heart rate, cardiac monitor, continuous pulse ox and blood pressure Approach: midline Location: L3-4 Injection technique: single-shot Needle Needle type: Pencan  Needle gauge: 24 G Assessment Events: second provider Additional Notes Consent was obtained prior to the procedure with all questions answered and concerns addressed. Risks including, but not limited to, bleeding, infection, nerve damage, paralysis, failed block, inadequate analgesia, allergic reaction, high spinal, itching, and headache were discussed and the patient wished to proceed. Functioning IV was confirmed and monitors were applied. Sterile prep and drape, including hand hygiene, mask, and sterile gloves were used. The patient was positioned and the spine was prepped. The skin was anesthetized with lidocaine. After failed attempt by CRNA, Dr. Mal Amabile successful on first attempt. Free flow of clear CSF was obtained prior to injecting local anesthetic into the CSF. The spinal needle aspirated freely following injection. The needle was carefully withdrawn. The patient tolerated the procedure well.   Leslye Peer, MD

## 2021-04-09 NOTE — Anesthesia Postprocedure Evaluation (Signed)
Anesthesia Post Note  Patient: Tabitha Robertson  Procedure(s) Performed: TOTAL HIP ARTHROPLASTY ANTERIOR APPROACH (Right: Hip)     Patient location during evaluation: PACU Anesthesia Type: Spinal Level of consciousness: awake and alert Pain management: pain level controlled Vital Signs Assessment: post-procedure vital signs reviewed and stable Respiratory status: spontaneous breathing and respiratory function stable Cardiovascular status: blood pressure returned to baseline and stable Postop Assessment: spinal receding and no apparent nausea or vomiting Anesthetic complications: no   No notable events documented.  Last Vitals:  Vitals:   04/09/21 1445 04/09/21 1500  BP: (!) 101/49 105/66  Pulse: (!) 51 (!) 56  Resp: (!) 9 15  Temp:    SpO2: 100% 100%    Last Pain:  Vitals:   04/09/21 1500  PainSc: Asleep                 [name]

## 2021-04-09 NOTE — Transfer of Care (Signed)
Immediate Anesthesia Transfer of Care Note  Patient: Tabitha Robertson  Procedure(s) Performed: TOTAL HIP ARTHROPLASTY ANTERIOR APPROACH (Right: Hip)  Patient Location: PACU  Anesthesia Type:Spinal  Level of Consciousness: awake, alert  and oriented  Airway & Oxygen Therapy: Patient Spontanous Breathing and Patient connected to face mask oxygen  Post-op Assessment: Report given to RN and Post -op Vital signs reviewed and stable  Post vital signs: Reviewed and stable  Last Vitals:  Vitals Value Taken Time  BP 117/54 04/09/21 1251  Temp    Pulse 53 04/09/21 1253  Resp 15 04/09/21 1253  SpO2 100 % 04/09/21 1253  Vitals shown include unvalidated device data.  Last Pain:  Vitals:   04/09/21 0905  PainSc: 4       Patients Stated Pain Goal: 4 (04/09/21 0905)  Complications: No notable events documented.

## 2021-04-09 NOTE — Plan of Care (Signed)

## 2021-04-09 NOTE — Anesthesia Preprocedure Evaluation (Addendum)
Anesthesia Evaluation  Patient identified by MRN, date of birth, ID band Patient awake    Reviewed: Allergy & Precautions, NPO status , Patient's Chart, lab work & pertinent test results  History of Anesthesia Complications Negative for: history of anesthetic complications  Airway Mallampati: III  TM Distance: >3 FB Neck ROM: Full    Dental  (+) Dental Advisory Given, Poor Dentition, Chipped   Pulmonary sleep apnea , former smoker,    Pulmonary exam normal        Cardiovascular negative cardio ROS Normal cardiovascular exam     Neuro/Psych PSYCHIATRIC DISORDERS Anxiety negative neurological ROS     GI/Hepatic hiatal hernia, GERD  Controlled,(+)     substance abuse  marijuana use,   Endo/Other   Obesity   Renal/GU negative Renal ROS     Musculoskeletal  (+) Arthritis , Fibromyalgia -  Abdominal   Peds  Hematology  Plt 282k    Anesthesia Other Findings Covid test negative   Reproductive/Obstetrics                            Anesthesia Physical Anesthesia Plan  ASA: 2  Anesthesia Plan: Spinal   Post-op Pain Management:    Induction:   PONV Risk Score and Plan: 2 and Treatment may vary due to age or medical condition and Propofol infusion  Airway Management Planned: Natural Airway and Simple Face Mask  Additional Equipment: None  Intra-op Plan:   Post-operative Plan:   Informed Consent: I have reviewed the patients History and Physical, chart, labs and discussed the procedure including the risks, benefits and alternatives for the proposed anesthesia with the patient or authorized representative who has indicated his/her understanding and acceptance.       Plan Discussed with: CRNA and Anesthesiologist  Anesthesia Plan Comments: (Labs reviewed, platelets acceptable. Discussed risks and benefits of spinal, including spinal/epidural hematoma, infection, failed block,  and PDPH. Patient expressed understanding and wished to proceed. )       Anesthesia Quick Evaluation

## 2021-04-09 NOTE — Op Note (Signed)
NAME:  Shanaia Sievers NO.: [id number]      MEDICAL RECORD NO.: [id number]      FACILITY:  [facility]      PHYSICIAN:  Shelda Pal  DATE OF BIRTH:  May 31, 1966     DATE OF PROCEDURE:  04/09/2021                                 OPERATIVE REPORT         PREOPERATIVE DIAGNOSIS: Right  hip osteoarthritis.      POSTOPERATIVE DIAGNOSIS:  Right hip osteoarthritis.      PROCEDURE:  Right total hip replacement through an anterior approach   utilizing DePuy THR system, component size 50 mm pinnacle cup, a size 32+4 neutral   Altrex liner, a size 5 Hi Actis stem with a 32+1 delta ceramic   ball.      SURGEON:  Madlyn Frankel. Charlann Boxer, M.D.      ASSISTANT:  Rosalene Billings PA-C     ANESTHESIA:  Spinal.      SPECIMENS:  None.      COMPLICATIONS:  None.      BLOOD LOSS:  700 cc     DRAINS:  None.      INDICATION OF THE PROCEDURE:  Tabitha Robertson is a 55 y.o. female who had   presented to office for evaluation of right hip pain.  Radiographs revealed   progressive degenerative changes with bone-on-bone   articulation of the  hip joint, including subchondral cystic changes and osteophytes.  The patient had painful limited range of   motion significantly affecting their overall quality of life and function.  The patient was failing to    respond to conservative measures including medications and/or injections and activity modification and at this point was ready   to proceed with more definitive measures.  Consent was obtained for   benefit of pain relief.  Specific risks of infection, DVT, component   failure, dislocation, neurovascular injury, and need for revision surgery were reviewed in the office as well discussion of   the anterior versus posterior approach were reviewed.     PROCEDURE IN DETAIL:  The patient was brought to operative theater.   Once adequate anesthesia, preoperative antibiotics, 2 gm of Ancef, 1 gm of Tranexamic Acid,  and 10 mg of Decadron were administered, the patient was positioned supine on the [employer] table.  Once the patient was safely positioned with adequate padding of boney prominences we predraped out the hip, and used fluoroscopy to confirm orientation of the pelvis.      The right hip was then prepped and draped from proximal iliac crest to   mid thigh with a shower curtain technique.      Time-out was performed identifying the patient, planned procedure, and the appropriate extremity.     An incision was then made 2 cm lateral to the   anterior superior iliac spine extending over the orientation of the   tensor fascia lata muscle and sharp dissection was carried down to the   fascia of the muscle.      The fascia was then incised.  The muscle belly was identified and swept   laterally and retractor placed along the superior neck.  Following   cauterization of the circumflex vessels and removing some  pericapsular   fat, a second cobra retractor was placed on the inferior neck.  A T-capsulotomy was made along the line of the   superior neck to the trochanteric fossa, then extended proximally and   distally.  Tag sutures were placed and the retractors were then placed   intracapsular.  We then identified the trochanteric fossa and   orientation of my neck cut and then made a neck osteotomy with the femur on traction.  The femoral   head was removed without difficulty or complication.  Traction was let   off and retractors were placed posterior and anterior around the   acetabulum.      The labrum and foveal tissue were debrided.  I began reaming with a 44 mm   reamer and reamed up to 49 mm reamer with good bony bed preparation and a 50 mm  cup was chosen.  The final 50 mm Pinnacle cup was then impacted under fluoroscopy to confirm the depth of penetration and orientation with respect to   Abduction and forward flexion.  A screw was placed into the ilium followed by the hole eliminator.  The  final   32+4 neutral Altrex liner was impacted with good visualized rim fit.  The cup was positioned anatomically within the acetabular portion of the pelvis.      At this point, the femur was rolled to 100 degrees.  Further capsule was   released off the inferior aspect of the femoral neck.  I then   released the superior capsule proximally.  With the leg in a neutral position the hook was placed laterally   along the femur under the vastus lateralis origin and elevated manually and then held in position using the hook attachment on the bed.  The leg was then extended and adducted with the leg rolled to 100   degrees of external rotation.  Retractors were placed along the medial calcar and posteriorly over the greater trochanter.  Once the proximal femur was fully   exposed, I used a box osteotome to set orientation.  I then began   broaching with the starting chili pepper broach and passed this by hand and then broached up to 5.  With the 5 broach in place I chose a high offset neck and did several trial reductions.  The offset was appropriate, leg lengths   appeared to be equal best matched with the +1 head ball trial confirmed radiographically.   Given these findings, I went ahead and dislocated the hip, repositioned all   retractors and positioned the right hip in the extended and abducted position.  The final 5 Hi Actis stem was   chosen and it was impacted down to the level of neck cut.  Based on this   and the trial reductions, a final 32+1 delta ceramic ball was chosen and   impacted onto a clean and dry trunnion, and the hip was reduced.  The   hip had been irrigated throughout the case again at this point.  I did   reapproximate the superior capsular leaflet to the anterior leaflet   using #1 Vicryl.  The fascia of the   tensor fascia lata muscle was then reapproximated using #1 Vicryl and #0 Stratafix sutures.  The   remaining wound was closed with 2-0 Vicryl and running 4-0  Monocryl.   The hip was cleaned, dried, and dressed sterilely using Dermabond and   Aquacel dressing.  The patient was then brought   to  recovery room in stable condition tolerating the procedure well.    Rosalene Billings, PA-C was present for the entirety of the case involved from   preoperative positioning, perioperative retractor management, general   facilitation of the case, as well as primary wound closure as assistant.            Madlyn Frankel Charlann Boxer, M.D.        04/09/2021 11:10 AM

## 2021-04-09 NOTE — Discharge Instructions (Signed)

## 2021-04-09 NOTE — Interval H&P Note (Signed)
History and Physical Interval Note:  04/09/2021 9:59 AM  Tabitha Robertson  has presented today for surgery, with the diagnosis of right hip osteoarthritis.  The various methods of treatment have been discussed with the patient and family. After consideration of risks, benefits and other options for treatment, the patient has consented to  Procedure(s): TOTAL HIP ARTHROPLASTY ANTERIOR APPROACH (Right) as a surgical intervention.  The patient's history has been reviewed, patient examined, no change in status, stable for surgery.  I have reviewed the patient's chart and labs.  Questions were answered to the patient's satisfaction.     Shelda Pal

## 2021-04-10 DIAGNOSIS — M1611 Unilateral primary osteoarthritis, right hip: Secondary | ICD-10-CM | POA: Diagnosis not present

## 2021-04-10 LAB — BASIC METABOLIC PANEL
Anion gap: 6 (ref 5–15)
BUN: 13 mg/dL (ref 6–20)
CO2: 24 mmol/L (ref 22–32)
Calcium: 8.2 mg/dL — ABNORMAL LOW (ref 8.9–10.3)
Chloride: 106 mmol/L (ref 98–111)
Creatinine, Ser: 0.67 mg/dL (ref 0.44–1.00)
GFR, Estimated: >60 mL/min (ref >60)
Glucose, Bld: 130 mg/dL — ABNORMAL HIGH (ref 70–99)
Potassium: 4 mmol/L (ref 3.5–5.1)
Sodium: 136 mmol/L (ref 135–145)

## 2021-04-10 LAB — CBC
HCT: 28.5 % — ABNORMAL LOW (ref 36.0–46.0)
Hemoglobin: 9.2 g/dL — ABNORMAL LOW (ref 12.0–15.0)
MCH: 29.4 pg (ref 26.0–34.0)
MCHC: 32.3 g/dL (ref 30.0–36.0)
MCV: 91.1 fL (ref 80.0–100.0)
Platelets: 220 10*3/uL (ref 150–400)
RBC: 3.13 MIL/uL — ABNORMAL LOW (ref 3.87–5.11)
RDW: 13.5 % (ref 11.5–15.5)
WBC: 13.8 10*3/uL — ABNORMAL HIGH (ref 4.0–10.5)
nRBC: 0 % (ref 0.0–0.2)

## 2021-04-10 MED ORDER — METHOCARBAMOL 500 MG PO TABS: 500.0000 mg | ORAL_TABLET | Freq: Four times a day (QID) | ORAL | 0 refills | Status: AC | PRN

## 2021-04-10 MED ORDER — HYDROCODONE-ACETAMINOPHEN 5-325 MG PO TABS: 1.0000 | ORAL_TABLET | Freq: Four times a day (QID) | ORAL | 0 refills | Status: AC | PRN

## 2021-04-10 MED ORDER — CELECOXIB 200 MG PO CAPS: 200.0000 mg | ORAL_CAPSULE | Freq: Two times a day (BID) | ORAL | 0 refills | Status: AC

## 2021-04-10 MED ORDER — DOCUSATE SODIUM 100 MG PO CAPS: 100.0000 mg | ORAL_CAPSULE | Freq: Two times a day (BID) | ORAL | 0 refills | Status: AC

## 2021-04-10 MED ORDER — ASPIRIN 81 MG PO CHEW: 81.0000 mg | CHEWABLE_TABLET | Freq: Two times a day (BID) | ORAL | 0 refills | Status: AC

## 2021-04-10 NOTE — Evaluation (Signed)
Physical Therapy Evaluation Patient Details Name: Tabitha Robertson MRN: [id number] DOB: [birth year] Today's Date: 04/10/2021   History of Present Illness  Pt is a 55 year old female s/p Rt DA THA.  PMHx: fibromyalgia, hiatal hernia  Clinical Impression  Pt is s/p THA resulting in the deficits listed below (see PT Problem List). Pt will benefit from skilled PT to increase their independence and safety with mobility to allow discharge to the venue listed below.  Pt assisted with ambulating in hallway and performed LE exercises.  Pt plans to d/c home with relative however reports she assists with taking care of her son's twins (almost 53 years old) and hopeful for a quick recovery.     Follow Up Recommendations Follow surgeon's recommendation for DC plan and follow-up therapies    Equipment Recommendations  Rolling walker with 5" wheels;3in1 (PT)    Recommendations for Other Services       Precautions / Restrictions Precautions Precautions: Fall Restrictions Weight Bearing Restrictions: No RLE Weight Bearing: Weight bearing as tolerated      Mobility  Bed Mobility               General bed mobility comments: pt in recliner on arrival    Transfers Overall transfer level: Needs assistance Equipment used: Rolling walker (2 wheeled) Transfers: Sit to/from Stand Sit to Stand: Min guard         General transfer comment: verbal cues for UE and LE positioning  Ambulation/Gait Ambulation/Gait assistance: Min guard Gait Distance (Feet): 200 Feet Assistive device: Rolling walker (2 wheeled) Gait Pattern/deviations: Step-to pattern;Step-through pattern;Decreased stance time - right;Antalgic     General Gait Details: verbal cues for sequence, RW positioning, step length, increased time  Stairs            Wheelchair Mobility    Modified Rankin (Stroke Patients Only)       Balance                                             Pertinent  Vitals/Pain Pain Assessment: 0-10 Pain Score: 7  Pain Location: right hip Pain Descriptors / Indicators: Sore;Aching Pain Intervention(s): Monitored during session;Repositioned;Ice applied    Home Living Family/patient expects to be discharged to:: Private residence Living Arrangements: Children;Spouse/significant other   Type of Home: House Home Access: Stairs to enter   Secretary/administrator of Steps: 4 Home Layout: Able to live on main level with bedroom/bathroom Home Equipment: None Additional Comments: pt plans to stay at [facility]", 1 step to enter, main level    Prior Function Level of Independence: Independent               Hand Dominance        Extremity/Trunk Assessment        Lower Extremity Assessment Lower Extremity Assessment: RLE deficits/detail RLE Deficits / Details: anticipated post op hip weakness, grossly 2+/5, able to perform ankle pumps, good quad set       Communication   Communication: No difficulties  Cognition Arousal/Alertness: Awake/alert Behavior During Therapy: WFL for tasks assessed/performed Overall Cognitive Status: Within Functional Limits for tasks assessed                                        General Comments  Exercises Total Joint Exercises Ankle Circles/Pumps: AROM;Both;10 reps Quad Sets: AROM;Both;10 reps Heel Slides: AAROM;Right;10 reps Hip ABduction/ADduction: AAROM;10 reps;Right Long Arc Quad: AROM;Right;10 reps   Assessment/Plan    PT Assessment Patient needs continued PT services  PT Problem List Decreased strength;Decreased mobility;Decreased balance;Decreased activity tolerance;Decreased knowledge of use of DME;Pain       PT Treatment Interventions Stair training;Gait training;DME instruction;Therapeutic exercise;Functional mobility training;Therapeutic activities;Patient/family education    PT Goals (Current goals can be found in the Care Plan section)  Acute Rehab PT Goals PT  Goal Formulation: With patient Time For Goal Achievement: 04/15/21 Potential to Achieve Goals: Good    Frequency 7X/week   Barriers to discharge        Co-evaluation               AM-PAC PT "6 Clicks" Mobility  Outcome Measure Help needed turning from your back to your side while in a flat bed without using bedrails?: A Little Help needed moving from lying on your back to sitting on the side of a flat bed without using bedrails?: A Little Help needed moving to and from a bed to a chair (including a wheelchair)?: A Little Help needed standing up from a chair using your arms (e.g., wheelchair or bedside chair)?: A Little Help needed to walk in hospital room?: A Little Help needed climbing 3-5 steps with a railing? : A Little 6 Click Score: 18    End of Session Equipment Utilized During Treatment: Gait belt Activity Tolerance: Patient tolerated treatment well Patient left: in chair;with call bell/phone within reach   PT Visit Diagnosis: Other abnormalities of gait and mobility (R26.89)    Time: 1779-3903 PT Time Calculation (min) (ACUTE ONLY): 20 min   Charges:   PT Evaluation $PT Eval Low Complexity: 1 Low        Kati PT, DPT Acute Rehabilitation Services Pager: [phone] Office: [phone]   Wessley Emert,KATHrine E 04/10/2021, 11:40 AM

## 2021-04-10 NOTE — Progress Notes (Signed)
   Subjective: 1 Day Post-Op Procedure(s) (LRB): TOTAL HIP ARTHROPLASTY ANTERIOR APPROACH (Right) Patient reports pain as mild.   Patient seen in rounds for Dr. Charlann Boxer. Patient is well, and has had no acute complaints or problems overnight. Foley catheter removed. She did not have PT yesterday, but was able to ambulate with her nurse.  We will start therapy today.   Objective: Vital signs in last 24 hours: Temp:  [96.9 F (36.1 C)-98.3 F (36.8 C)] 98.3 F (36.8 C) (07/13 0609) Pulse Rate:  [51-72] 72 (07/13 0609) Resp:  [9-20] 17 (07/13 0609) BP: (101-132)/(49-77) 126/62 (07/13 0609) SpO2:  [96 %-100 %] 97 % (07/13 0609) Weight:  [100.2 kg] 100.2 kg (07/12 0905)  Intake/Output from previous day:  Intake/Output Summary (Last 24 hours) at 04/10/2021 0844 Last data filed at 04/10/2021 0839 Gross per 24 hour  Intake 2163.08 ml  Output 4650 ml  Net -2486.92 ml     Intake/Output this shift: Total I/O In: -  Out: 400 [Urine:400]  Labs: Recent Labs    04/10/21 0317  HGB 9.2*   Recent Labs    04/10/21 0317  WBC 13.8*  RBC 3.13*  HCT 28.5*  PLT 220   Recent Labs    04/10/21 0317  NA 136  K 4.0  CL 106  CO2 24  BUN 13  CREATININE 0.67  GLUCOSE 130*  CALCIUM 8.2*   No results for input(s): LABPT, INR in the last 72 hours.  Exam: General - Patient is Alert and Oriented Extremity - Neurologically intact Sensation intact distally Intact pulses distally Dorsiflexion/Plantar flexion intact Dressing - dressing C/D/I Motor Function - intact, moving foot and toes well on exam.   Past Medical History:  Diagnosis Date   Anxiety    Arthritis    Fibromyalgia    GERD (gastroesophageal reflux disease)    History of hiatal hernia    Sleep apnea    does not use cpap not used in 10 years     Assessment/Plan: 1 Day Post-Op Procedure(s) (LRB): TOTAL HIP ARTHROPLASTY ANTERIOR APPROACH (Right) Active Problems:   S/P right total hip arthroplasty  Estimated body  mass index is 35.67 kg/m as calculated from the following:   Height as of this encounter: 5\' 6"  (1.676 m).   Weight as of this encounter: 100.2 kg. Advance diet Up with therapy D/C IV fluids  DVT Prophylaxis - Aspirin Weight bearing as tolerated.  Plan is to go Home after hospital stay with HEP. Plan for discharge today following 1-2 sessions of therapy as long as she is meeting her goals.   , PA-C Orthopedic Surgery [phone] 04/10/2021, 8:44 AM

## 2021-04-10 NOTE — TOC Transition Note (Signed)
Transition of Care [facility]) - CM/SW Discharge Note  Patient Details  Name: Tabitha Robertson MRN: [id number] Date of Birth: 1966/07/31  [facility] CM/SW Contact:  Sherie Don, LCSW Phone Number: 04/10/2021, 10:24 AM  Clinical Narrative: Patient to discharge after working with PT. CSW met with patient to confirm discharge plan and needs. Patient will discharge home with a home exercise program (HEP). Patient will need a rolling walker and 3N1. [employer] to deliver rolling walker and 3N1 to patient's room. TOC signing off.  Final next level of care: Home/Self Care Barriers to Discharge: No Barriers Identified  Patient Goals and CMS Choice Patient states their goals for this hospitalization and ongoing recovery are:: Discharge home with HEP CMS Medicare.gov Compare Post Acute Care list provided to:: Patient Choice offered to / list presented to : Patient  Discharge Plan and Services         DME Arranged: 3-N-1, Walker rolling DME Agency: [employer] Representative spoke with at DME Agency: Pre-arranged in orthopedist's office  Readmission Risk Interventions No flowsheet data found.

## 2021-04-10 NOTE — Progress Notes (Signed)
Physical Therapy Treatment Patient Details Name: Tabitha Robertson MRN: [id number] DOB: Feb 03, 1966 Today's Date: 04/10/2021    History of Present Illness Pt is a 55 year old female s/p Rt DA THA.  PMHx: fibromyalgia, hiatal hernia    PT Comments    Pt ambulated in hallway and practiced safe stair technique (per request).  Pt's stepdad present for session and observed.  Pt performed standing exercises this afternoon and provided with HEP handout.  Pt had no further questions and feels ready for d/c home today.    Follow Up Recommendations  Follow surgeon's recommendation for DC plan and follow-up therapies     Equipment Recommendations  Rolling walker with 5" wheels;3in1 (PT)    Recommendations for Other Services       Precautions / Restrictions Precautions Precautions: Fall Restrictions RLE Weight Bearing: Weight bearing as tolerated    Mobility  Bed Mobility   Bed Mobility: Supine to Sit;Sit to Supine     Supine to sit: Supervision;HOB elevated Sit to supine: Supervision;HOB elevated   General bed mobility comments: pt in recliner on arrival    Transfers Overall transfer level: Needs assistance Equipment used: Rolling walker (2 wheeled) Transfers: Sit to/from Stand Sit to Stand: Supervision         General transfer comment: verbal cues for UE and LE positioning  Ambulation/Gait Ambulation/Gait assistance: Supervision Gait Distance (Feet): 160 Feet Assistive device: Rolling walker (2 wheeled) Gait Pattern/deviations: Step-through pattern;Decreased stance time - right;Antalgic     General Gait Details: verbal cues for sequence, RW positioning, step length   Stairs Stairs: Yes Stairs assistance: Min guard;Supervision Stair Management: Step to pattern;Forwards;Two rails Number of Stairs: 2 General stair comments: verbal cues for safety and sequence; pt performed twice, stepdad present and observed   Wheelchair Mobility    Modified Rankin (Stroke  Patients Only)       Balance                                            Cognition Arousal/Alertness: Awake/alert Behavior During Therapy: WFL for tasks assessed/performed Overall Cognitive Status: Within Functional Limits for tasks assessed                                        Exercises Total Joint Exercises Hip ABduction/ADduction: AROM;Right;10 reps;Standing Long Arc Quad: AROM;Right;10 reps Knee Flexion: AROM;Right;10 reps;Standing Marching in Standing: AROM;Right;10 reps;Standing    General Comments        Pertinent Vitals/Pain Pain Assessment: 0-10 Pain Score: 6  Pain Location: right hip Pain Descriptors / Indicators: Sore;Aching Pain Intervention(s): Monitored during session;Ice applied;Repositioned    Home Living Family/patient expects to be discharged to:: Private residence Living Arrangements: Children;Spouse/significant other   Type of Home: House Home Access: Stairs to enter   Home Layout: Able to live on main level with bedroom/bathroom Home Equipment: None Additional Comments: pt plans to stay at "[facility]", 1 step to enter, main level    Prior Function Level of Independence: Independent          PT Goals (current goals can now be found in the care plan section) Acute Rehab PT Goals PT Goal Formulation: With patient Time For Goal Achievement: 04/15/21 Potential to Achieve Goals: Good Progress towards PT goals: Progressing toward goals    Frequency  7X/week      PT Plan Current plan remains appropriate    Co-evaluation              AM-PAC PT "6 Clicks" Mobility   Outcome Measure  Help needed turning from your back to your side while in a flat bed without using bedrails?: A Little Help needed moving from lying on your back to sitting on the side of a flat bed without using bedrails?: A Little Help needed moving to and from a bed to a chair (including a wheelchair)?: A Little Help needed  standing up from a chair using your arms (e.g., wheelchair or bedside chair)?: A Little Help needed to walk in hospital room?: A Little Help needed climbing 3-5 steps with a railing? : A Little 6 Click Score: 18    End of Session Equipment Utilized During Treatment: Gait belt Activity Tolerance: Patient tolerated treatment well Patient left: with call bell/phone within reach;in bed;with family/visitor present   PT Visit Diagnosis: Other abnormalities of gait and mobility (R26.89)     Time: 7741-4239 PT Time Calculation (min) (ACUTE ONLY): 11 min  Charges:  $Gait Training: 8-22 mins                    Paulino Door, DPT Acute Rehabilitation Services Pager: [phone] Office: [phone]  Maida Sale E 04/10/2021, 2:56 PM

## 2021-04-11 ENCOUNTER — Encounter (HOSPITAL_COMMUNITY): Payer: Self-pay | Admitting: Orthopedic Surgery

## 2021-04-24 NOTE — Discharge Summary (Signed)
Physician Discharge Summary   Patient ID: Tabitha Robertson MRN: [id number] DOB/AGE: [birth year] 53 y.o.  Admit date: 04/09/2021 Discharge date: 04/10/2021  Primary Diagnosis: Right  hip osteoarthritis.  Admission Diagnoses:  Past Medical History:  Diagnosis Date   Anxiety    Arthritis    Fibromyalgia    GERD (gastroesophageal reflux disease)    History of hiatal hernia    Sleep apnea    does not use cpap not used in 10 years    Discharge Diagnoses:   Active Problems:   S/P right total hip arthroplasty  Estimated body mass index is 35.67 kg/m as calculated from the following:   Height as of this encounter:  (1.676 m).   Weight as of this encounter: 100.2 kg.  Procedure:  Procedure(s) (LRB): TOTAL HIP ARTHROPLASTY ANTERIOR APPROACH (Right)   Consults: None  HPI:  Tabitha Robertson is a 55 y.o. female who had  presented to office for evaluation of right hip pain.  Radiographs revealed  progressive degenerative changes with bone-on-bone  articulation of the  hip joint, including subchondral cystic changes and osteophytes.  The patient had painful limited range of  motion significantly affecting their overall quality of life and function.  The patient was failing to    respond to conservative measures including medications and/or injections and activity modification and at this point was ready  to proceed with more definitive measures.  Consent was obtained for  benefit of pain relief.  Specific risks of infection, DVT, component  failure, dislocation, neurovascular injury, and need for revision surgery were reviewed in the office as well discussion of  the anterior versus posterior approach were reviewed.  Laboratory Data: Admission on 04/09/2021, Discharged on 04/10/2021  Component Date Value Ref Range Status   WBC 04/10/2021 13.8 (A) 4.0 - 10.5 K/uL Final   RBC 04/10/2021 3.13 (A) 3.87 - 5.11 MIL/uL Final   Hemoglobin 04/10/2021 9.2 (A) 12.0 - 15.0 g/dL Final    HCT 11/91/4782 28.5 (A) 36.0 - 46.0 % Final   MCV 04/10/2021 91.1  80.0 - 100.0 fL Final   MCH 04/10/2021 29.4  26.0 - 34.0 pg Final   MCHC 04/10/2021 32.3  30.0 - 36.0 g/dL Final   RDW 95/62/1308 13.5  11.5 - 15.5 % Final   Platelets 04/10/2021 220  150 - 400 K/uL Final   nRBC 04/10/2021 0.0  0.0 - 0.2 % Final   Performed at [facility], [address], [place], [place] [address]   Sodium 04/10/2021 136  135 - 145 mmol/L Final   Potassium 04/10/2021 4.0  3.5 - 5.1 mmol/L Final   Chloride 04/10/2021 106  98 - 111 mmol/L Final   CO2 04/10/2021 24  22 - 32 mmol/L Final   Glucose, Bld 04/10/2021 130 (A) 70 - 99 mg/dL Final   Glucose reference range applies only to samples taken after fasting for at least 8 hours.   BUN 04/10/2021 13  6 - 20 mg/dL Final   Creatinine, Ser 04/10/2021 0.67  0.44 - 1.00 mg/dL Final   Calcium 69/62/9528 8.2 (A) 8.9 - 10.3 mg/dL Final   GFR, Estimated 04/10/2021 >60  >60 mL/min Final   Comment: (NOTE) Calculated using the CKD-EPI Creatinine Equation (2021)    Anion gap 04/10/2021 6  5 - 15 Final   Performed at [facility], [address] [place], [place] [address]  Hospital Outpatient Visit on 04/05/2021  Component Date Value Ref Range Status   SARS  Coronavirus 2 04/05/2021 NEGATIVE  NEGATIVE Final   Comment: (NOTE) SARS-CoV-2 target nucleic acids are NOT DETECTED.  The SARS-CoV-2 RNA is generally detectable in upper and lower respiratory specimens during the acute phase of infection. Negative results do not preclude SARS-CoV-2 infection, do not rule out co-infections with other pathogens, and should not be used as the sole basis for treatment or other patient management decisions. Negative results must be combined with clinical observations, patient history, and epidemiological information. The expected result is Negative.  Fact Sheet for Patients: HairSlick.no  Fact Sheet for  Healthcare Providers: quierodirigir.com  This test is not yet approved or cleared by the [place] FDA and  has been authorized for detection and/or diagnosis of SARS-CoV-2 by FDA under an Emergency Use Authorization (EUA). This EUA will remain  in effect (meaning this test can be used) for the duration of the COVID-19 declaration under Se                          ction 564(b)(1) of the Act, 21 U.S.C. section 360bbb-3(b)(1), unless the authorization is terminated or revoked sooner.  Performed at [facility], [address], [place], [place] [address]   Hospital Outpatient Visit on 03/29/2021  Component Date Value Ref Range Status   WBC 03/29/2021 8.9  4.0 - 10.5 K/uL Final   RBC 03/29/2021 4.21  3.87 - 5.11 MIL/uL Final   Hemoglobin 03/29/2021 12.3  12.0 - 15.0 g/dL Final   HCT 52/84/1324 38.5  36.0 - 46.0 % Final   MCV 03/29/2021 91.4  80.0 - 100.0 fL Final   MCH 03/29/2021 29.2  26.0 - 34.0 pg Final   MCHC 03/29/2021 31.9  30.0 - 36.0 g/dL Final   RDW 40/06/2724 14.2  11.5 - 15.5 % Final   Platelets 03/29/2021 282  150 - 400 K/uL Final   nRBC 03/29/2021 0.0  0.0 - 0.2 % Final   Performed at [facility], [address], [place], [place] [address]   Sodium 03/29/2021 136  135 - 145 mmol/L Final   Potassium 03/29/2021 4.1  3.5 - 5.1 mmol/L Final   Chloride 03/29/2021 105  98 - 111 mmol/L Final   CO2 03/29/2021 25  22 - 32 mmol/L Final   Glucose, Bld 03/29/2021 99  70 - 99 mg/dL Final   Glucose reference range applies only to samples taken after fasting for at least 8 hours.   BUN 03/29/2021 11  6 - 20 mg/dL Final   Creatinine, Ser 03/29/2021 0.76  0.44 - 1.00 mg/dL Final   Calcium 03/47/4259 8.9  8.9 - 10.3 mg/dL Final   Total Protein 56/38/7564 7.1  6.5 - 8.1 g/dL Final   Albumin 33/29/5188 3.8  3.5 - 5.0 g/dL Final   AST 41/66/0630 18  15 - 41 U/L Final   ALT 03/29/2021 13  0 - 44 U/L Final   Alkaline Phosphatase  03/29/2021 169 (A) 38 - 126 U/L Final   Total Bilirubin 03/29/2021 0.5  0.3 - 1.2 mg/dL Final   GFR, Estimated 03/29/2021 >60  >60 mL/min Final   Comment: (NOTE) Calculated using the CKD-EPI Creatinine Equation (2021)    Anion gap 03/29/2021 6  5 - 15 Final   Performed at [facility], [address], [place], [place] [address]   Prothrombin Time 03/29/2021 12.2  11.4 - 15.2 seconds Final   INR 03/29/2021 0.9  0.8 - 1.2 Final   Comment: (  NOTE) INR goal varies based on device and disease states. Performed at [facility], [address] [place], [place] [address]    aPTT 03/29/2021 34  24 - 36 seconds Final   Performed at [facility], [address], [place], [place] [address]   ABO/RH(D) 03/29/2021 O NEG   Final   Antibody Screen 03/29/2021 NEG   Final   Sample Expiration 03/29/2021 04/12/2021,2359   Final   Extend sample reason 03/29/2021    Final                   Value:NO TRANSFUSIONS OR PREGNANCY IN THE PAST 3 MONTHS Performed at [facility], [address], [place], [place] [address]    MRSA, PCR 03/29/2021 NEGATIVE  NEGATIVE Final   Staphylococcus aureus 03/29/2021 POSITIVE (A) NEGATIVE Final   Comment: (NOTE) The Xpert SA Assay (FDA approved for NASAL specimens in patients 64 years of age and older), is one component of a comprehensive surveillance program. It is not intended to diagnose infection nor to guide or monitor treatment. Performed at [facility], [address] [place], [place] [address]   Hospital Outpatient Visit on 02/15/2021  Component Date Value Ref Range Status   MRSA, PCR 02/15/2021 NEGATIVE  NEGATIVE Final   Staphylococcus aureus 02/15/2021 NEGATIVE  NEGATIVE Final   Comment: (NOTE) The Xpert SA Assay (FDA approved for NASAL specimens in patients 45 years of age and older), is one component of a comprehensive surveillance program. It is not intended to  diagnose infection nor to guide or monitor treatment. Performed at [facility], [address] [place], [place] [address]    WBC 02/15/2021 6.5  4.0 - 10.5 K/uL Final   RBC 02/15/2021 4.33  3.87 - 5.11 MIL/uL Final   Hemoglobin 02/15/2021 13.1  12.0 - 15.0 g/dL Final   HCT 13/04/6577 39.8  36.0 - 46.0 % Final   MCV 02/15/2021 91.9  80.0 - 100.0 fL Final   MCH 02/15/2021 30.3  26.0 - 34.0 pg Final   MCHC 02/15/2021 32.9  30.0 - 36.0 g/dL Final   RDW 46/96/2952 13.5  11.5 - 15.5 % Final   Platelets 02/15/2021 263  150 - 400 K/uL Final   nRBC 02/15/2021 0.0  0.0 - 0.2 % Final   Performed at [facility], [address], [place], [place] [address]   Sodium 02/15/2021 137  135 - 145 mmol/L Final   Potassium 02/15/2021 3.8  3.5 - 5.1 mmol/L Final   Chloride 02/15/2021 105  98 - 111 mmol/L Final   CO2 02/15/2021 23  22 - 32 mmol/L Final   Glucose, Bld 02/15/2021 95  70 - 99 mg/dL Final   Glucose reference range applies only to samples taken after fasting for at least 8 hours.   BUN 02/15/2021 13  6 - 20 mg/dL Final   Creatinine, Ser 02/15/2021 0.87  0.44 - 1.00 mg/dL Final   Calcium 44/09/270 8.7 (A) 8.9 - 10.3 mg/dL Final   Total Protein 53/66/4403 7.2  6.5 - 8.1 g/dL Final   Albumin 47/42/5956 4.0  3.5 - 5.0 g/dL Final   AST 38/75/6433 18  15 - 41 U/L Final   ALT 02/15/2021 14  0 - 44 U/L Final   Alkaline Phosphatase 02/15/2021 178 (A) 38 - 126 U/L Final   Total Bilirubin 02/15/2021 0.4  0.3 - 1.2 mg/dL Final   GFR, Estimated 02/15/2021 >60  >60 mL/min  Final   Comment: (NOTE) Calculated using the CKD-EPI Creatinine Equation (2021)    Anion gap 02/15/2021 9  5 - 15 Final   Performed at [facility], [address] [place], [place] [address]   Prothrombin Time 02/15/2021 12.3  11.4 - 15.2 seconds Final   INR 02/15/2021 0.9  0.8 - 1.2 Final   Comment: (NOTE) INR goal varies based on device and disease states. Performed at  [facility], [address] [place], [place] [address]    aPTT 02/15/2021 35  24 - 36 seconds Final   Performed at [facility], [address] [place], [place] [address]   ABO/RH(D) 02/15/2021 O NEG   Final   Antibody Screen 02/15/2021 NEG   Final   Sample Expiration 02/15/2021 03/01/2021,2359   Final   Extend sample reason 02/15/2021    Final                   Value:NO TRANSFUSIONS OR PREGNANCY IN THE PAST 3 MONTHS Performed at [facility], [address] [place], [place] [address]      X-Rays:DG Pelvis Portable  Result Date: 04/09/2021 CLINICAL DATA:  Status post right hip replacement today. EXAM: PORTABLE PELVIS 1-2 VIEWS COMPARISON:  Intraoperative image today. FINDINGS: Right total hip arthroplasty is in place. The device is located. No fracture or other abnormality. Gas in the soft tissues from surgery noted. IMPRESSION: Status post right hip replacement.  Otherwise negative. Electronically Signed   By: Drusilla Kannerhomas  Dalessio M.D.   On: 04/09/2021 13:53   DG C-Arm 1-60 Min-No Report  Result Date: 04/09/2021 CLINICAL DATA:  Intraoperative imaging for patient undergoing right hip replacement. EXAM: OPERATIVE RIGHT HIP (WITH PELVIS IF PERFORMED) 2 VIEWS TECHNIQUE: Fluoroscopic spot image(s) were submitted for interpretation post-operatively. COMPARISON:  Plain films right hip 12/17/2020. FINDINGS: Provided images demonstrate a femoral stem and acetabular cup in place. Head of the femoral prosthesis has not yet been placed. Surgical wound noted. IMPRESSION: Intraoperative imaging for right hip replacement.  No acute finding. Electronically Signed   By: Drusilla Kannerhomas  Dalessio M.D.   On: 04/09/2021 14:43   DG HIP OPERATIVE UNILAT W OR W/O PELVIS RIGHT  Result Date: 04/09/2021 CLINICAL DATA:  Intraoperative imaging for patient undergoing right hip replacement. EXAM: OPERATIVE RIGHT HIP (WITH PELVIS IF PERFORMED) 2 VIEWS TECHNIQUE: Fluoroscopic  spot image(s) were submitted for interpretation post-operatively. COMPARISON:  Plain films right hip 12/17/2020. FINDINGS: Provided images demonstrate a femoral stem and acetabular cup in place. Head of the femoral prosthesis has not yet been placed. Surgical wound noted. IMPRESSION: Intraoperative imaging for right hip replacement.  No acute finding. Electronically Signed   By: Drusilla Kannerhomas  Dalessio M.D.   On: 04/09/2021 14:43    EKG:No orders found for this or any previous visit.   Hospital Course: Tabitha Robertson is a 55 y.o. who was [facility]. They were brought to the operating room on 04/09/2021 and underwent Procedure(s): TOTAL HIP ARTHROPLASTY ANTERIOR APPROACH.  Patient tolerated the procedure well and was later transferred to the recovery room and then to the orthopaedic floor for postoperative care. They were given PO and IV analgesics for pain control following their surgery. They were given 24 hours of postoperative antibiotics of  Anti-infectives (From admission, onward)    Start     Dose/Rate Route Frequency Ordered Stop   04/09/21 1700  ceFAZolin (ANCEF) 2 g in sodium chloride 0.9 % 100 mL IVPB  2 g 200 mL/hr over 30 Minutes Intravenous Every 6 hours 04/09/21 1542 04/10/21 0016   04/09/21 0915  ceFAZolin (ANCEF) 2 g in sodium chloride 0.9 % 100 mL IVPB  Status:  Discontinued        2 g 200 mL/hr over 30 Minutes Intravenous On call 04/09/21 0901 04/09/21 0903   04/09/21 0900  ceFAZolin (ANCEF) 2 g in sodium chloride 0.9 % 100 mL IVPB        2 g 200 mL/hr over 30 Minutes Intravenous On call 04/09/21 0850 04/09/21 1110      and started on DVT prophylaxis in the form of Aspirin.   PT and OT were ordered for total joint protocol. Discharge planning consulted to help with postop disposition and equipment needs.  Patient had a good night on the evening of surgery. They started to get up OOB with therapy on POD #1. Pt was seen during rounds and was ready to go  home pending progress with therapy.She worked with therapy on POD #1 and was meeting her goals. Pt was discharged to home later that day in stable condition.  Diet: Regular diet Activity: WBAT Follow-up: in 2 weeks Disposition: Home Discharged Condition: good   Discharge Instructions     Call MD / Call 911   Complete by: As directed    If you experience chest pain or shortness of breath, CALL 911 and be transported to the hospital emergency room.  If you develope a fever above 101 F, pus (white drainage) or increased drainage or redness at the wound, or calf pain, call your surgeon's office.   Change dressing   Complete by: As directed    Maintain surgical dressing until follow up in the clinic. If the edges start to pull up, may reinforce with tape. If the dressing is no longer working, may remove and cover with gauze and tape, but must keep the area dry and clean.  Call with any questions or concerns.   Constipation Prevention   Complete by: As directed    Drink plenty of fluids.  Prune juice may be helpful.  You may use a stool softener, such as Colace (over the counter) 100 mg twice a day.  Use MiraLax (over the counter) for constipation as needed.   Diet - low sodium heart healthy   Complete by: As directed    Increase activity slowly as tolerated   Complete by: As directed    Weight bearing as tolerated with assist device (walker, cane, etc) as directed, use it as long as suggested by your surgeon or therapist, typically at least 4-6 weeks.   Post-operative opioid taper instructions:   Complete by: As directed    POST-OPERATIVE OPIOID TAPER INSTRUCTIONS: It is important to wean off of your opioid medication as soon as possible. If you do not need pain medication after your surgery it is ok to stop day one. Opioids include: Codeine, Hydrocodone(Norco, Vicodin), Oxycodone(Percocet, oxycontin) and hydromorphone amongst others.  Long term and even short term use of opiods can  cause: Increased pain response Dependence Constipation Depression Respiratory depression And more.  Withdrawal symptoms can include Flu like symptoms Nausea, vomiting And more Techniques to manage these symptoms Hydrate well Eat regular healthy meals Stay active Use relaxation techniques(deep breathing, meditating, yoga) Do Not substitute Alcohol to help with tapering If you have been on opioids for less than two weeks and do not have pain than it is ok to stop all together.  Plan to wean off  of opioids This plan should start within one week post op of your joint replacement. Maintain the same interval or time between taking each dose and first decrease the dose.  Cut the total daily intake of opioids by one tablet each day Next start to increase the time between doses. The last dose that should be eliminated is the evening dose.      TED hose   Complete by: As directed    Use stockings (TED hose) for 2 weeks on both leg(s).  You may remove them at night for sleeping.      Allergies as of 04/10/2021   No Known Allergies      Medication List     STOP taking these medications    acetaminophen 500 MG tablet Commonly known as: TYLENOL   ibuprofen 200 MG tablet Commonly known as: ADVIL   traMADol 50 MG tablet Commonly known as: ULTRAM       TAKE these medications    ALPHA LIPOIC ACID PO Take 1 capsule by mouth daily.   amitriptyline 50 MG tablet Commonly known as: ELAVIL Take 50 mg by mouth daily.   aspirin 81 MG chewable tablet Chew 1 tablet (81 mg total) by mouth 2 (two) times daily for 28 days.   b complex vitamins capsule Take 1 capsule by mouth daily.   B-12 PO Take 1 capsule by mouth daily.   celecoxib 200 MG capsule Commonly known as: CELEBREX Take 1 capsule (200 mg total) by mouth 2 (two) times daily.   CHOLINE PO Take 1 tablet by mouth 3 (three) times daily with meals.   diclofenac Sodium 1 % Gel Commonly known as: VOLTAREN Apply 1  application topically 4 (four) times daily as needed (pain).   docusate sodium 100 MG capsule Commonly known as: COLACE Take 1 capsule (100 mg total) by mouth 2 (two) times daily.   HYDROcodone-acetaminophen 5-325 MG tablet Commonly known as: NORCO/VICODIN Take 1-2 tablets by mouth every 6 (six) hours as needed for severe pain.   LUTEIN PO Take 1 capsule by mouth daily.   MAGNESIUM PO Take 1 tablet by mouth daily.   methocarbamol 500 MG tablet Commonly known as: ROBAXIN Take 1 tablet (500 mg total) by mouth every 6 (six) hours as needed for muscle spasms. What changed:  when to take this reasons to take this   polyethylene glycol 17 g packet Commonly known as: MIRALAX / GLYCOLAX Take 17 g by mouth daily.   POTASSIUM PO Take 1 tablet by mouth daily.   TURMERIC PO Take 1 tablet by mouth daily.   VITAMIN A PO Take 1 capsule by mouth daily.   VITAMIN C PO Take 1 tablet by mouth daily.   VITAMIN D PO Take 3 capsules by mouth daily.   VITAMIN E PO Take 1 capsule by mouth daily.   ZINC PO Take 1 tablet by mouth daily.               Discharge Care Instructions  (From admission, onward)           Start     Ordered   04/10/21 0000  Change dressing       Comments: Maintain surgical dressing until follow up in the clinic. If the edges start to pull up, may reinforce with tape. If the dressing is no longer working, may remove and cover with gauze and tape, but must keep the area dry and clean.  Call with any questions or concerns.  04/10/21 0849            Follow-up Information     Durene Romans, MD. Schedule an appointment as soon as possible for a visit in 2 week(s).   Specialty: Orthopedic Surgery Contact information: [address] [place] [address] [place] [address] [phone]                 Signed: Dennie Bible, PA-C Orthopedic Surgery 04/24/2021, 1:40 PM

## 2021-04-26 ENCOUNTER — Ambulatory Visit (HOSPITAL_COMMUNITY)
Admission: EM | Admit: 2021-04-26 | Discharge: 2021-04-26 | Disposition: A | Discharge status: Home / Self Care | Payer: BC Managed Care – PPO | Attending: Emergency Medicine | Admitting: Emergency Medicine

## 2021-04-26 ENCOUNTER — Encounter (HOSPITAL_COMMUNITY)
Admission: EM | Disposition: A | Discharge status: Home / Self Care | Payer: Self-pay | Source: Home / Self Care | Attending: Emergency Medicine

## 2021-04-26 ENCOUNTER — Emergency Department (HOSPITAL_COMMUNITY): Payer: BC Managed Care – PPO

## 2021-04-26 ENCOUNTER — Emergency Department (HOSPITAL_COMMUNITY): Payer: BC Managed Care – PPO | Admitting: Certified Registered"

## 2021-04-26 ENCOUNTER — Other Ambulatory Visit: Payer: Self-pay

## 2021-04-26 ENCOUNTER — Encounter (HOSPITAL_COMMUNITY): Payer: Self-pay | Admitting: *Deleted

## 2021-04-26 DIAGNOSIS — S73004A Unspecified dislocation of right hip, initial encounter: Secondary | ICD-10-CM

## 2021-04-26 DIAGNOSIS — Z7982 Long term (current) use of aspirin: Secondary | ICD-10-CM | POA: Diagnosis not present

## 2021-04-26 DIAGNOSIS — Y792 Prosthetic and other implants, materials and accessory orthopedic devices associated with adverse incidents: Secondary | ICD-10-CM | POA: Diagnosis not present

## 2021-04-26 DIAGNOSIS — Z96641 Presence of right artificial hip joint: Secondary | ICD-10-CM | POA: Diagnosis not present

## 2021-04-26 DIAGNOSIS — T84020A Dislocation of internal right hip prosthesis, initial encounter: Secondary | ICD-10-CM | POA: Insufficient documentation

## 2021-04-26 DIAGNOSIS — S73014A Posterior dislocation of right hip, initial encounter: Secondary | ICD-10-CM

## 2021-04-26 DIAGNOSIS — Y92008 Other place in unspecified non-institutional (private) residence as the place of occurrence of the external cause: Secondary | ICD-10-CM | POA: Diagnosis not present

## 2021-04-26 DIAGNOSIS — Z791 Long term (current) use of non-steroidal anti-inflammatories (NSAID): Secondary | ICD-10-CM | POA: Diagnosis not present

## 2021-04-26 DIAGNOSIS — Z79899 Other long term (current) drug therapy: Secondary | ICD-10-CM | POA: Insufficient documentation

## 2021-04-26 DIAGNOSIS — Z20822 Contact with and (suspected) exposure to covid-19: Secondary | ICD-10-CM | POA: Insufficient documentation

## 2021-04-26 DIAGNOSIS — Z87891 Personal history of nicotine dependence: Secondary | ICD-10-CM | POA: Insufficient documentation

## 2021-04-26 DIAGNOSIS — W19XXXA Unspecified fall, initial encounter: Secondary | ICD-10-CM | POA: Diagnosis not present

## 2021-04-26 HISTORY — PX: HIP CLOSED REDUCTION: SHX983

## 2021-04-26 LAB — BASIC METABOLIC PANEL
Anion gap: 8 (ref 5–15)
BUN: 17 mg/dL (ref 6–20)
CO2: 21 mmol/L — ABNORMAL LOW (ref 22–32)
Calcium: 8.1 mg/dL — ABNORMAL LOW (ref 8.9–10.3)
Chloride: 108 mmol/L (ref 98–111)
Creatinine, Ser: 0.72 mg/dL (ref 0.44–1.00)
GFR, Estimated: >60 mL/min (ref >60)
Glucose, Bld: 99 mg/dL (ref 70–99)
Potassium: 3.9 mmol/L (ref 3.5–5.1)
Sodium: 137 mmol/L (ref 135–145)

## 2021-04-26 LAB — CBC
HCT: 25.8 % — ABNORMAL LOW (ref 36.0–46.0)
Hemoglobin: 8 g/dL — ABNORMAL LOW (ref 12.0–15.0)
MCH: 29.2 pg (ref 26.0–34.0)
MCHC: 31 g/dL (ref 30.0–36.0)
MCV: 94.2 fL (ref 80.0–100.0)
Platelets: 301 10*3/uL (ref 150–400)
RBC: 2.74 MIL/uL — ABNORMAL LOW (ref 3.87–5.11)
RDW: 14.6 % (ref 11.5–15.5)
WBC: 6.5 10*3/uL (ref 4.0–10.5)
nRBC: 0 % (ref 0.0–0.2)

## 2021-04-26 LAB — RESP PANEL BY RT-PCR (FLU A&B, COVID) ARPGX2
Influenza A by PCR: NEGATIVE
Influenza B by PCR: NEGATIVE
SARS Coronavirus 2 by RT PCR: NEGATIVE

## 2021-04-26 SURGERY — CLOSED REDUCTION, HIP
Anesthesia: General | Site: Hip | Laterality: Right

## 2021-04-26 MED ORDER — OXYCODONE HCL 5 MG/5ML PO SOLN
5.0000 mg | Freq: Once | ORAL | Status: DC | PRN
Start: 2021-04-26 — End: 2021-04-27

## 2021-04-26 MED ORDER — ACETAMINOPHEN 10 MG/ML IV SOLN
INTRAVENOUS | Status: AC
  Filled 2021-04-26: qty 100

## 2021-04-26 MED ORDER — ONDANSETRON HCL 4 MG/2ML IJ SOLN: 4.0000 mg | Freq: Once | INTRAMUSCULAR | Status: DC

## 2021-04-26 MED ORDER — ACETAMINOPHEN 10 MG/ML IV SOLN
1000.0000 mg | Freq: Once | INTRAVENOUS | Status: DC | PRN
  Administered 2021-04-26: 1000 mg via INTRAVENOUS

## 2021-04-26 MED ORDER — MIDAZOLAM HCL 2 MG/2ML IJ SOLN
INTRAMUSCULAR | Status: AC
  Filled 2021-04-26: qty 2

## 2021-04-26 MED ORDER — HYDROMORPHONE HCL 1 MG/ML IJ SOLN
0.2500 mg | INTRAMUSCULAR | Status: DC | PRN
  Administered 2021-04-26: 0.5 mg via INTRAVENOUS

## 2021-04-26 MED ORDER — DEXAMETHASONE SODIUM PHOSPHATE 10 MG/ML IJ SOLN
INTRAMUSCULAR | Status: DC | PRN
  Administered 2021-04-26: 10 mg via INTRAVENOUS

## 2021-04-26 MED ORDER — OXYCODONE HCL 5 MG PO TABS: 5.0000 mg | ORAL_TABLET | Freq: Once | ORAL | Status: DC | PRN

## 2021-04-26 MED ORDER — AMISULPRIDE (ANTIEMETIC) 5 MG/2ML IV SOLN
10.0000 mg | Freq: Once | INTRAVENOUS | Status: DC | PRN
Start: 2021-04-26 — End: 2021-04-27

## 2021-04-26 MED ORDER — ONDANSETRON HCL 4 MG/2ML IJ SOLN
INTRAMUSCULAR | Status: DC | PRN
  Administered 2021-04-26: 4 mg via INTRAVENOUS

## 2021-04-26 MED ORDER — PROPOFOL 10 MG/ML IV BOLUS
0.5000 mg/kg | Freq: Once | INTRAVENOUS | Status: AC
  Administered 2021-04-26: 37.8 mg via INTRAVENOUS
  Filled 2021-04-26: qty 20

## 2021-04-26 MED ORDER — LIDOCAINE 2% (20 MG/ML) 5 ML SYRINGE
INTRAMUSCULAR | Status: AC
  Filled 2021-04-26: qty 5

## 2021-04-26 MED ORDER — PROPOFOL 10 MG/ML IV BOLUS
INTRAVENOUS | Status: AC
  Filled 2021-04-26: qty 20

## 2021-04-26 MED ORDER — ONDANSETRON HCL 4 MG/2ML IJ SOLN
INTRAMUSCULAR | Status: AC
  Filled 2021-04-26: qty 2

## 2021-04-26 MED ORDER — KETAMINE HCL 50 MG/5ML IJ SOSY
0.5000 mg/kg | PREFILLED_SYRINGE | Freq: Once | INTRAMUSCULAR | Status: AC
  Administered 2021-04-26: 38 mg via INTRAVENOUS
  Filled 2021-04-26: qty 5

## 2021-04-26 MED ORDER — PROPOFOL 500 MG/50ML IV EMUL
INTRAVENOUS | Status: DC | PRN
  Administered 2021-04-26: 200 mg via INTRAVENOUS

## 2021-04-26 MED ORDER — MIDAZOLAM HCL 2 MG/2ML IJ SOLN
INTRAMUSCULAR | Status: DC | PRN
  Administered 2021-04-26: 2 mg via INTRAVENOUS

## 2021-04-26 MED ORDER — ONDANSETRON HCL 4 MG/2ML IJ SOLN
4.0000 mg | Freq: Once | INTRAMUSCULAR | Status: DC | PRN
Start: 2021-04-26 — End: 2021-04-27

## 2021-04-26 MED ORDER — HYDROMORPHONE HCL 1 MG/ML IJ SOLN
1.0000 mg | Freq: Once | INTRAMUSCULAR | Status: AC
  Administered 2021-04-26: 1 mg via INTRAVENOUS
  Filled 2021-04-26: qty 1

## 2021-04-26 MED ORDER — HYDROMORPHONE HCL 1 MG/ML IJ SOLN
INTRAMUSCULAR | Status: AC
  Filled 2021-04-26: qty 1

## 2021-04-26 MED ORDER — DEXAMETHASONE SODIUM PHOSPHATE 10 MG/ML IJ SOLN
INTRAMUSCULAR | Status: AC
  Filled 2021-04-26: qty 1

## 2021-04-26 MED ORDER — FENTANYL CITRATE (PF) 100 MCG/2ML IJ SOLN
INTRAMUSCULAR | Status: AC
  Filled 2021-04-26: qty 2

## 2021-04-26 MED ORDER — LIDOCAINE 2% (20 MG/ML) 5 ML SYRINGE
INTRAMUSCULAR | Status: DC | PRN
  Administered 2021-04-26: 60 mg via INTRAVENOUS

## 2021-04-26 MED ORDER — FENTANYL CITRATE (PF) 100 MCG/2ML IJ SOLN
INTRAMUSCULAR | Status: DC | PRN
  Administered 2021-04-26: 50 ug via INTRAVENOUS

## 2021-04-26 MED ORDER — LACTATED RINGERS IV SOLN: INTRAVENOUS | Status: DC

## 2021-04-26 MED ORDER — PROPOFOL 10 MG/ML IV BOLUS
37.8000 mg | Freq: Once | INTRAVENOUS | Status: AC
  Administered 2021-04-26: 37.8 mg via INTRAVENOUS

## 2021-04-26 MED ORDER — CHLORHEXIDINE GLUCONATE 0.12 % MT SOLN
15.0000 mL | Freq: Once | OROMUCOSAL | Status: AC
  Administered 2021-04-26: 15 mL via OROMUCOSAL

## 2021-04-26 SURGICAL SUPPLY — 2 items
IMMOBILIZER KNEE 20 (SOFTGOODS) ×2 IMPLANT
IMMOBILIZER KNEE 20 THIGH 36 (SOFTGOODS) ×1 IMPLANT

## 2021-04-26 NOTE — ED Triage Notes (Signed)
Pt presents to ED via ems cc fall, right hip dislocation. Pt was taken to [facility] ED in [place], [place]. Xrays at [facility] showed right hip dislocation. Pt states she had right hip replaced at [facility] on 04/09/2021. Pt transferred to [facility] for ortho services. Pt respirations equal, unlabored. A&Ox4.

## 2021-04-26 NOTE — Transfer of Care (Signed)
Immediate Anesthesia Transfer of Care Note  Patient: Tabitha Robertson  Procedure(s) Performed: Procedure(s): CLOSED REDUCTION HIP (Right)  Patient Location: PACU  Anesthesia Type:General  Level of Consciousness: Alert, Awake, Oriented  Airway & Oxygen Therapy: Patient Spontanous Breathing  Post-op Assessment: Report given to RN  Post vital signs: Reviewed and stable  Last Vitals:  Vitals:   04/26/21 1515 04/26/21 1706  BP: 108/61 116/71  Pulse: 75 80  Resp: 19 16  Temp:  36.6 C  SpO2: 98% 93%    Complications: No apparent anesthesia complications

## 2021-04-26 NOTE — Progress Notes (Signed)
End Tidal CO2 measurement during conscious sedation ranged from 30-37.

## 2021-04-26 NOTE — Anesthesia Preprocedure Evaluation (Signed)
Anesthesia Evaluation  Patient identified by MRN, date of birth, ID band Patient awake    Reviewed: Allergy & Precautions, NPO status , Patient's Chart, lab work & pertinent test results  History of Anesthesia Complications Negative for: history of anesthetic complications  Airway Mallampati: III  TM Distance: >3 FB Neck ROM: Full    Dental  (+) Dental Advisory Given, Poor Dentition, Chipped   Pulmonary sleep apnea , former smoker,    Pulmonary exam normal        Cardiovascular negative cardio ROS Normal cardiovascular exam     Neuro/Psych PSYCHIATRIC DISORDERS Anxiety negative neurological ROS     GI/Hepatic hiatal hernia, GERD  Controlled,(+)     substance abuse  marijuana use,   Endo/Other   Obesity   Renal/GU negative Renal ROS     Musculoskeletal  (+) Arthritis , Fibromyalgia -  Abdominal (+) + obese (BMI 35.58),   Peds  Hematology  Plt 282k    Anesthesia Other Findings Covid test negative   Reproductive/Obstetrics                             Anesthesia Physical Anesthesia Plan  ASA: 3 and emergent  Anesthesia Plan: General   Post-op Pain Management:    Induction: Intravenous  PONV Risk Score and Plan: Treatment may vary due to age or medical condition and Ondansetron  Airway Management Planned: LMA  Additional Equipment: None  Intra-op Plan:   Post-operative Plan:   Informed Consent: I have reviewed the patients History and Physical, chart, labs and discussed the procedure including the risks, benefits and alternatives for the proposed anesthesia with the patient or authorized representative who has indicated his/her understanding and acceptance.     Dental advisory given  Plan Discussed with:   Anesthesia Plan Comments:         Anesthesia Quick Evaluation

## 2021-04-26 NOTE — Consult Note (Signed)
Reason for Consult: Right total hip dislocation Referring Physician: EDP  Tabitha Robertson is an 55 y.o. female.  [id number] is two weeks s/p right anterior total hip replacement and fell yesterday.  She complained of a pop in her hip and pain after the fall.  She was unable to ambulate after the fall. No other complaints. Patient seen initially up in [place] at the ER and was transferred to [facility] where her surgery was.   Past Medical History:  Diagnosis Date   Anxiety    Arthritis    Fibromyalgia    GERD (gastroesophageal reflux disease)    History of hiatal hernia    Sleep apnea    does not use cpap not used in 10 years     Past Surgical History:  Procedure Laterality Date   APPENDECTOMY     BILATERAL CARPAL TUNNEL RELEASE     BREAST LUMPECTOMY     CESAREAN SECTION     x 3   SMALL INTESTINE SURGERY     TONSILLECTOMY     and adenoidectomy    TOTAL HIP ARTHROPLASTY Right 04/09/2021   Procedure: TOTAL HIP ARTHROPLASTY ANTERIOR APPROACH;  Surgeon: Durene Romans, MD;  Location: [facility];  Service: Orthopedics;  Laterality: Right;    No family history on file.  Social History:  reports that she quit smoking about 3 years ago. Her smoking use included cigarettes. She has never used smokeless tobacco. She reports previous alcohol use. She reports current drug use. Drug: Marijuana.  Allergies: No Known Allergies  Medications: I have reviewed the patient's current medications.  Results for orders placed or performed during the hospital encounter of 04/26/21 (from the past 48 hour(s))  Basic metabolic panel     Status: Abnormal   Collection Time: 04/26/21 12:32 PM  Result Value Ref Range   Sodium 137 135 - 145 mmol/L   Potassium 3.9 3.5 - 5.1 mmol/L   Chloride 108 98 - 111 mmol/L   CO2 21 (L) 22 - 32 mmol/L   Glucose, Bld 99 70 - 99 mg/dL    Comment: Glucose reference range applies only to samples taken after fasting for at least 8 hours.   BUN 17 6 - 20 mg/dL    Creatinine, Ser 8.29 0.44 - 1.00 mg/dL   Calcium 8.1 (L) 8.9 - 10.3 mg/dL   GFR, Estimated >93 >71 mL/min    Comment: (NOTE) Calculated using the CKD-EPI Creatinine Equation (2021)    Anion gap 8 5 - 15    Comment: Performed at [facility], [address], [place], [place] [address]  CBC     Status: Abnormal   Collection Time: 04/26/21 12:32 PM  Result Value Ref Range   WBC 6.5 4.0 - 10.5 K/uL   RBC 2.74 (L) 3.87 - 5.11 MIL/uL   Hemoglobin 8.0 (L) 12.0 - 15.0 g/dL   HCT 93.8 (L) 10.1 - 75.1 %   MCV 94.2 80.0 - 100.0 fL   MCH 29.2 26.0 - 34.0 pg   MCHC 31.0 30.0 - 36.0 g/dL   RDW 02.5 85.2 - 77.8 %   Platelets 301 150 - 400 K/uL   nRBC 0.0 0.0 - 0.2 %    Comment: Performed at [facility], [address], [place], [place] [address]  Resp Panel by RT-PCR (Flu A&B, Covid) Nasopharyngeal Swab     Status: None   Collection Time: 04/26/21 12:53 PM   Specimen: Nasopharyngeal Swab; Nasopharyngeal(NP) swabs in vial transport medium  Result Value Ref Range   SARS Coronavirus 2 by RT PCR NEGATIVE NEGATIVE    Comment: (NOTE) SARS-CoV-2 target nucleic acids are NOT DETECTED.  The SARS-CoV-2 RNA is generally detectable in upper respiratory specimens during the acute phase of infection. The lowest concentration of SARS-CoV-2 viral copies this assay can detect is 138 copies/mL. A negative result does not preclude SARS-Cov-2 infection and should not be used as the sole basis for treatment or other patient management decisions. A negative result may occur with  improper specimen collection/handling, submission of specimen other than nasopharyngeal swab, presence of viral mutation(s) within the areas targeted by this assay, and inadequate number of viral copies(<138 copies/mL). A negative result must be combined with clinical observations, patient history, and epidemiological information. The expected result is Negative.  Fact Sheet for Patients:   BloggerCourse.com  Fact Sheet for Healthcare Providers:  SeriousBroker.it  This test is no t yet approved or cleared by the [place] FDA and  has been authorized for detection and/or diagnosis of SARS-CoV-2 by FDA under an Emergency Use Authorization (EUA). This EUA will remain  in effect (meaning this test can be used) for the duration of the COVID-19 declaration under Section 564(b)(1) of the Act, 21 U.S.C.section 360bbb-3(b)(1), unless the authorization is terminated  or revoked sooner.       Influenza A by PCR NEGATIVE NEGATIVE   Influenza B by PCR NEGATIVE NEGATIVE    Comment: (NOTE) The Xpert Xpress SARS-CoV-2/FLU/RSV plus assay is intended as an aid in the diagnosis of influenza from Nasopharyngeal swab specimens and should not be used as a sole basis for treatment. Nasal washings and aspirates are unacceptable for Xpert Xpress SARS-CoV-2/FLU/RSV testing.  Fact Sheet for Patients: BloggerCourse.com  Fact Sheet for Healthcare Providers: SeriousBroker.it  This test is not yet approved or cleared by the [place] FDA and has been authorized for detection and/or diagnosis of SARS-CoV-2 by FDA under an Emergency Use Authorization (EUA). This EUA will remain in effect (meaning this test can be used) for the duration of the COVID-19 declaration under Section 564(b)(1) of the Act, 21 U.S.C. section 360bbb-3(b)(1), unless the authorization is terminated or revoked.  Performed at [facility], [address], [place], [place] [address]     DG Hip [place] or [place] Pelvis 1 View Right  Result Date: 04/26/2021 CLINICAL DATA:  Hip arthroplasty 04/09/2021. Rule out dislocation. Postreduction EXAM: DG HIP (WITH OR WITHOUT PELVIS) 1V PORT RIGHT COMPARISON:  04/26/2021 FINDINGS: Partial reduction of the right hip. The right femur prosthesis remains  subluxed laterally but improved. No fracture. IMPRESSION: Partial reduction of right hip dislocation Electronically Signed   By: Marlan Palau M.D.   On: 04/26/2021 15:35   DG Hip Port [place] Pelvis 1 View Right  Result Date: 04/26/2021 CLINICAL DATA:  Right hip pain since a fall yesterday. Initial encounter. EXAM: DG HIP (WITH OR WITHOUT PELVIS) 1V PORT RIGHT COMPARISON:  Intraoperative imaging right hip 04/09/2021. FINDINGS: The patient has a right hip arthroplasty. The device is dislocated. No fracture is identified. Soft tissues are negative. IMPRESSION: Dislocated right hip arthroplasty.  No fracture. Electronically Signed   By: Drusilla Kanner M.D.   On: 04/26/2021 13:55    Review of Systems Blood pressure 108/61, pulse 75, temperature 100.3 F (37.9 C), temperature source Oral, resp. rate 19, height 5\' 6"  (1.676 m), weight 100 kg, SpO2 98 %. Physical Exam Awake and alert. Moderate distress. Pain free AROM of the  neck and shoulders. No pain with AROM of the elbows and wrists.  Abdomen soft Right LE is shortened and externally rotated.Left leg with pain free AROM of the hip knee and ankle.   Assessment/Plan: Right hip traumatic dislocation following total hip replacement. I recommended closed reduction under anesthesia and the patient and family agreed to this plan. Attempts at reducing the hip in the ED were unsuccessful.   Verlee Rossetti 04/26/2021, 4:36 PM

## 2021-04-26 NOTE — Op Note (Signed)
OPERATIVE REPORT   04/26/2021  5:53 PM  PATIENT:  Tabitha Robertson   SURGEON:  Jonette Pesa, MD  ASSISTANT:  Alphonsa Overall, Pa-C.   PREOPERATIVE DIAGNOSIS:  PERIPROSTHETIC RIGHT HIP DISLOCATION  POSTOPERATIVE DIAGNOSIS:  Same.  PROCEDURE:  CLOSED REDUCTION RIGHT HIP  ANESTHESIA:   GETA.  ANTIBIOTICS:  None.  IMPLANTS:  None.  SPECIMENS:  None.  COMPLICATIONS:  None.  DISPOSITION:  Stable to PACU.  SURGICAL INDICATIONS:  [name] is a 55 y.o. female underwent right anterior total hip arthroplasty on 04/09/2021 by Dr. Charlann Boxer.  She was going up some stairs earlier today, when she fell onto her butt.  She had immediate right hip pain and felt a loud pop.  She was then unable to weight-bear.  She was brought to the emergency department at [facility], where x-rays revealed a periprosthetic right hip dislocation.  Dr. Ranell Patrick attempted reduction in the emergency department unsuccessfully, and then asked for my assistance.  The risks, benefits, and alternatives were discussed with the patient preoperatively including but not limited to the risks of infection, bleeding, nerve / blood vessel injury, malunion, nonunion, cardiopulmonary complications, the need for repeat surgery, among others, and the patient was willing to proceed.  PROCEDURE IN DETAIL: Patient was identified in holding area using 2 identifiers.  The surgical site was marked by myself.  She was taken the operating room, and general anesthesia was induced on the stretcher.  Timeout was called, verifying site and site of surgery.  IV antibiotics were not given as none were indicated.    Brad Dixon, PA-C stabilized the pelvis.  The hip and knee were flexed to 90 degrees, and longitudinal traction was applied.  An audible and palpable clunk was heard.  Portable AP pelvis x-ray was obtained confirming reduction.  A knee immobilizer was placed.  Patient was then awakened from anesthesia and taken to the PACU  in stable condition.  POSTOPERATIVE PLAN: Postoperatively, the patient will be discharged to home.  She may weight-bear as tolerated in the knee immobilizer.  Follow-up with Dr. Charlann Boxer within 2 weeks.

## 2021-04-26 NOTE — ED Provider Notes (Signed)
[facility]-EMERGENCY DEPT Provider Note   CSN: [id number] Arrival date & time: 04/26/21  1200     History Chief Complaint  Patient presents with   Wayne BothFall    Tabitha Robertson is a 55 y.o. female w/ hx of right hip arthroplasty on 04/09/21 with Dr. Charlann Boxerlin present emergency department with right hip pain after fall.  The patient reports that yesterday evening around 8 PM she was using her walker to try to climb the front door stoop at her parents house, where she lost her balance and fell backwards onto her backside.  She felt a pop in her right hip and feels that she dislocated the hip.  She went to a local emergency department where she lives in [place], was boarded in the ED overnight, and then transferred to the emergency department here in the morning for orthopedic evaluation.  She reports her pain is continues to be 10 out of 10.  It radiates down towards her toes, but she denies numbness or weakness in her lower extremity.  She is not on blood thinners.  She denies any other injuries.  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     History reviewed. No pertinent family history.  Social History   Tobacco Use   Smoking  status: Former    Types: Cigarettes    Quit date: 09/29/2017    Years since quitting: 3.5   Smokeless tobacco: Never  Vaping Use   Vaping Use: Never used  Substance Use Topics   Alcohol use: Not Currently    Comment: rare   Drug use: Yes    Types: Marijuana    Comment: last use 06/26/2021    Home Medications Prior to Admission medications   Medication Sig Start Date End Date Taking? Authorizing Provider  acetaminophen (TYLENOL) 650 MG CR tablet Take 1,300 mg by mouth every 8 (eight) hours as needed for pain.   Yes [provider]  amitriptyline (ELAVIL) 50 MG tablet Take 50 mg by mouth daily.   Yes [provider]  Ascorbic Acid (VITAMIN C PO) Take 1 tablet by mouth daily.   Yes [provider]  aspirin 81 MG chewable tablet Chew 1 tablet (81 mg total) by mouth 2 (two) times daily for 28 days. 04/10/21 05/08/21 Yes Rosalene Billingsonovan, Ashley R, PA-C  b complex vitamins capsule Take 1 capsule by mouth daily.   Yes [provider]  celecoxib (CELEBREX) 200 MG capsule Take 1 capsule (200 mg total) by mouth 2 (two) times daily. 04/10/21  Yes Cassandria Anger, PA-C  Cyanocobalamin (B-12 PO) Take 1 capsule by mouth daily.   Yes [provider]  diclofenac Sodium (VOLTAREN) 1 % GEL Apply 1 application topically 4 (four) times daily as needed (pain).   Yes [provider]  docusate sodium (COLACE) 100 MG capsule Take 1 capsule (100 mg total) by mouth 2 (two) times daily. 04/10/21  Yes Cassandria Anger, PA-C  HYDROcodone-acetaminophen (NORCO/VICODIN) 5-325 MG tablet Take 1-2 tablets by mouth every 6 (six) hours as needed for severe pain. 04/10/21  Yes Rosalene Billings R, PA-C  LUTEIN PO Take 1 capsule by mouth daily.   Yes [provider]  MAGNESIUM PO Take 1 tablet by mouth daily.   Yes [provider]  methocarbamol (ROBAXIN) 500 MG tablet Take 1 tablet (500 mg total) by mouth every 6 (six) hours as needed for muscle spasms. 04/10/21  Yes  Cassandria Anger, PA-C  Multiple Vitamins-Minerals (ZINC PO) Take 1 tablet by mouth daily.   Yes [provider]  polyethylene glycol (MIRALAX / GLYCOLAX) 17 g packet Take 17 g by mouth daily.   Yes [provider]  POTASSIUM PO Take 1 tablet by mouth daily.   Yes [provider]  TURMERIC PO Take 1 tablet by mouth daily.   Yes [provider]  VITAMIN A PO Take 1 capsule by mouth daily.   Yes [provider]  VITAMIN D PO Take 3 capsules by mouth daily.   Yes [provider]  VITAMIN E PO Take 1 capsule by mouth daily.   Yes [provider]  ALPHA LIPOIC ACID PO Take 1 capsule by mouth daily.    [provider]    Allergies    Patient has no known allergies.  Review of Systems   Review of Systems  Constitutional:  Negative for chills and fever.  Eyes:  Negative for visual disturbance.  Respiratory:  Negative for cough and shortness of breath.   Cardiovascular:  Negative for chest pain and palpitations.  Gastrointestinal:  Negative for abdominal pain and vomiting.  Musculoskeletal:  Positive for arthralgias and myalgias.  Skin:  Negative for color change and rash.  Neurological:  Negative for weakness and numbness.  All other systems reviewed and are negative.  Physical Exam Updated Vital Signs BP 116/71   Pulse 80   Temp 97.8 F (36.6 C) (Oral)   Resp 16   Ht 5\' 6"  (1.676 m)   Wt 100 kg   LMP 04/01/2021   SpO2 93%   BMI 35.58 kg/m   Physical Exam Constitutional:      General: She is not in acute distress.    Appearance: She is obese.  HENT:     Head: Normocephalic and atraumatic.  Eyes:     Conjunctiva/sclera: Conjunctivae normal.     Pupils: Pupils are equal, round, and reactive to light.  Cardiovascular:     Rate and Rhythm: Normal rate and regular rhythm.     Pulses: Normal pulses.  Pulmonary:     Effort: Pulmonary effort is normal. No respiratory distress.  Abdominal:     General:  There is no distension.     Tenderness: There is no abdominal tenderness.  Musculoskeletal:     Comments: Right lower extremity shortened and everted Right hip surgical scar clean, nonerythematous  Skin:    General: Skin is warm and dry.  Neurological:     General: No focal deficit present.     Mental Status: She  is alert and oriented to person, place, and time. Mental status is at baseline.  Psychiatric:        Mood and Affect: Mood normal.        Behavior: Behavior normal.    ED Results / Procedures / Treatments   Labs (all labs ordered are listed, but only abnormal results are displayed) Labs Reviewed  BASIC METABOLIC PANEL - Abnormal; Notable for the following components:      Result Value   CO2 21 (*)    Calcium 8.1 (*)    All other components within normal limits  CBC - Abnormal; Notable for the following components:   RBC 2.74 (*)    Hemoglobin 8.0 (*)    HCT 25.8 (*)    All other components within normal limits  RESP PANEL BY RT-PCR (FLU A&B, COVID) [id number]    EKG None  Radiology DG Hip [place] or [place] Pelvis 1 View Right  Result Date: 04/26/2021 CLINICAL DATA:  Hip arthroplasty 04/09/2021. Rule out dislocation. Postreduction EXAM: DG HIP (WITH OR WITHOUT PELVIS) 1V PORT RIGHT COMPARISON:  04/26/2021 FINDINGS: Partial reduction of the right hip. The right femur prosthesis remains subluxed laterally but improved. No fracture. IMPRESSION: Partial reduction of right hip dislocation Electronically Signed   By: Marlan Palau M.D.   On: 04/26/2021 15:35   DG Hip [place] or [place] Pelvis 1 View Right  Result Date: 04/26/2021 CLINICAL DATA:  Right hip pain since a fall yesterday. Initial encounter. EXAM: DG HIP (WITH OR WITHOUT PELVIS) 1V PORT RIGHT COMPARISON:  Intraoperative imaging right hip 04/09/2021. FINDINGS: The patient has a right hip arthroplasty. The device is dislocated. No fracture is identified. Soft tissues are negative. IMPRESSION: Dislocated right hip  arthroplasty.  No fracture. Electronically Signed   By: Drusilla Kanner M.D.   On: 04/26/2021 13:55    Procedures .Sedation  Date/Time: 04/26/2021 5:33 PM Performed by: Terald Sleeper, MD Authorized by: Terald Sleeper, MD   Consent:    Consent obtained:  Written   Consent given by:  Patient   Risks discussed:  Prolonged sedation necessitating reversal, prolonged hypoxia resulting in organ damage, respiratory compromise necessitating ventilatory assistance and intubation, vomiting, nausea, inadequate sedation, dysrhythmia and allergic reaction Universal protocol:    Procedure explained and questions answered to patient or proxy's satisfaction: yes     Relevant documents present and verified: yes     Test results available: yes     Imaging studies available: yes     Required blood products, implants, devices, and special equipment available: yes     Site/side marked: yes     Immediately prior to procedure, a time out was called: yes     Patient identity confirmed:  Arm band and verbally with patient Indications:    Procedure performed:  Dislocation reduction   Procedure necessitating sedation performed by:  Different physician Pre-sedation assessment:    Time since last food or drink:  12 hours   ASA classification: class 2 - patient with mild systemic disease     Mallampati score:  III - soft palate, base of uvula visible   Neck mobility: normal     Pre-sedation assessments completed and reviewed: airway patency, cardiovascular function, hydration status, mental status, nausea/vomiting, pain level, respiratory function and temperature   Immediate pre-procedure details:    Reassessment: Patient reassessed immediately prior to procedure     Reviewed: vital signs, relevant labs/tests and NPO status     Verified: bag  valve mask available, emergency equipment available, intubation equipment available, IV patency confirmed, oxygen available and reversal medications available    Procedure details (see MAR for exact dosages):    Preoxygenation:  Nasal cannula   Sedation: Propofol and ketamine.   Intended level of sedation: deep   Intra-procedure monitoring:  Blood pressure monitoring, cardiac monitor, continuous capnometry and continuous pulse oximetry   Intra-procedure events: hypercapnia and hypoxia     Intra-procedure management:  BVM ventilation, airway repositioning and supplemental oxygen   Total Provider sedation time (minutes):  45 Post-procedure details:    Attendance: Constant attendance by certified staff until patient recovered     Recovery: Patient returned to pre-procedure baseline     Post-sedation assessments completed and reviewed: airway patency, cardiovascular function, hydration status, mental status, nausea/vomiting, pain level and respiratory function     Patient is stable for discharge or admission: yes     Procedure completion:  Tolerated well, no immediate complications Comments:     Transient episode of apnea with hypoxia requiring bag ventilation for 1 minute, with subsequent immediate improvement of oxygenation Reduction attempted by Dr. Ranell Patrick from orthopedics   Medications Ordered in ED Medications  ondansetron [facility]) injection 4 mg ( Intravenous MAR Hold 04/26/21 1703)  lactated ringers infusion ( Intravenous New Bag/Given 04/26/21 1724)  HYDROmorphone (DILAUDID) injection 1 mg (1 mg Intravenous Given 04/26/21 1253)  propofol (DIPRIVAN) 10 mg/mL bolus/IV push 37.8 mg (37.8 mg Intravenous Given 04/26/21 1425)  ketamine 50 mg in normal saline 5 mL (10 mg/mL) syringe (38 mg Intravenous Given 04/26/21 1424)  propofol (DIPRIVAN) 10 mg/mL bolus/IV push 37.8 mg (37.8 mg Intravenous Given 04/26/21 1427)  chlorhexidine (PERIDEX) 0.12 % solution 15 mL (15 mLs Mouth/Throat Given 04/26/21 1729)    ED Course  I have reviewed the triage vital signs and the nursing notes.  Pertinent labs & imaging results that were available during my care of the  patient were reviewed by me and considered in my medical decision making (see chart for details).  Suspected right prosthetic hip dislocation She is neurovascularly intact I have ordered IV pain medication, made n.p.o., ordered basic labs. Her temperature was 100.73F today, unclear what might be causing this.  Have ordered a COVID screening test.  She otherwise nontachycardic.  She denies any other infectious symptoms and has no localizing symptoms for infection.  The surgical wound appears intact.  Clinical Course as of 04/26/21 1737  Fri Apr 26, 2021  1349 I spoke to [facility] come down to assist with attempted reduction at bedside.  They believe this is likely go back in with bedside reduction.  Patient consented for sedation with combination of propofol and ketamine, which I believe will work synergistically with her borderline low blood pressure.  She last ate over 12 hours ago, has not had anything more than a night shift in the past 12 hours for fluid. [MT]  1515 Unable to reduce hip at bedside with Dr Ranell Patrick.  Ortho planning for OR reduction directly from ED and likely discharge home after [MT]    Clinical Course User Index [MT] Alexina Niccoli, Kermit Balo, MD    Final Clinical Impression(s) / ED Diagnoses Final diagnoses:  Posterior dislocation of right hip, initial encounter [facility])    Rx / DC Orders ED Discharge Orders     None        Terald Sleeper, MD 04/26/21 1737

## 2021-04-26 NOTE — Anesthesia Procedure Notes (Signed)
Procedure Name: LMA Insertion Date/Time: 04/26/2021 5:45 PM Performed by: Basilio Cairo, CRNA Pre-anesthesia Checklist: Patient identified, Patient being monitored, Timeout performed, Emergency Drugs available and Suction available Patient Re-evaluated:Patient Re-evaluated prior to induction Oxygen Delivery Method: Circle system utilized Preoxygenation: Pre-oxygenation with 100% oxygen Induction Type: IV induction Ventilation: Mask ventilation without difficulty LMA: LMA inserted and LMA flexible inserted LMA Size: 4.0 Tube type: Oral Number of attempts: 1 Placement Confirmation: positive ETCO2 and breath sounds checked- equal and bilateral Tube secured with: Tape Dental Injury: Teeth and Oropharynx as per pre-operative assessment

## 2021-04-26 NOTE — Discharge Instructions (Signed)
Weight bearing as tolerated with a walker Wear knee immobilizer at all times Call [phone] ASAP to schedule a follow up appointment with Dr. Charlann Boxer within 2 weeks

## 2021-04-27 NOTE — Anesthesia Postprocedure Evaluation (Signed)
Anesthesia Post Note  Patient: Tabitha Robertson  Procedure(s) Performed: CLOSED REDUCTION HIP (Right: Hip)     Patient location during evaluation: PACU Anesthesia Type: General Level of consciousness: awake and alert Pain management: pain level controlled Vital Signs Assessment: post-procedure vital signs reviewed and stable Respiratory status: spontaneous breathing, nonlabored ventilation, respiratory function stable and patient connected to nasal cannula oxygen Cardiovascular status: blood pressure returned to baseline and stable Postop Assessment: no apparent nausea or vomiting Anesthetic complications: no   No notable events documented.  Last Vitals:  Vitals:   04/26/21 1845 04/26/21 1900  BP: 115/66 110/66  Pulse: 74 79  Resp: 20 14  Temp:  37.2 C  SpO2: 100% 99%    Last Pain:  Vitals:   04/26/21 1900  TempSrc:   PainSc: 4                  Trevor Iha

## 2021-04-28 ENCOUNTER — Encounter (HOSPITAL_COMMUNITY): Payer: Self-pay | Admitting: Orthopedic Surgery

## 2022-01-07 ENCOUNTER — Encounter (HOSPITAL_COMMUNITY): Payer: Self-pay | Admitting: *Deleted

## 2022-01-07 ENCOUNTER — Emergency Department (HOSPITAL_COMMUNITY)
Admission: EM | Admit: 2022-01-07 | Discharge: 2022-01-07 | Disposition: A | Discharge status: Home / Self Care | Payer: BC Managed Care – PPO | Attending: Emergency Medicine | Admitting: Emergency Medicine

## 2022-01-07 ENCOUNTER — Emergency Department (HOSPITAL_COMMUNITY): Payer: BC Managed Care – PPO

## 2022-01-07 ENCOUNTER — Other Ambulatory Visit: Payer: Self-pay

## 2022-01-07 DIAGNOSIS — R102 Pelvic and perineal pain: Secondary | ICD-10-CM | POA: Insufficient documentation

## 2022-01-07 DIAGNOSIS — Z96641 Presence of right artificial hip joint: Secondary | ICD-10-CM | POA: Diagnosis not present

## 2022-01-07 DIAGNOSIS — M545 Low back pain, unspecified: Secondary | ICD-10-CM | POA: Diagnosis present

## 2022-01-07 MED ORDER — PREDNISONE 20 MG PO TABS: 40.0000 mg | ORAL_TABLET | Freq: Every day | ORAL | 0 refills | Status: AC

## 2022-01-07 MED ORDER — KETOROLAC TROMETHAMINE 15 MG/ML IJ SOLN
15.0000 mg | Freq: Once | INTRAMUSCULAR | Status: AC
  Administered 2022-01-07: 15 mg via INTRAMUSCULAR
  Filled 2022-01-07: qty 1

## 2022-01-07 MED ORDER — HYDROMORPHONE HCL 1 MG/ML IJ SOLN
0.5000 mg | Freq: Once | INTRAMUSCULAR | Status: AC
  Administered 2022-01-07: 0.5 mg via INTRAMUSCULAR
  Filled 2022-01-07: qty 1

## 2022-01-07 NOTE — ED Provider Notes (Signed)
?[facility]-EMERGENCY DEPT ?Provider Note ? ? ?CSN: [id number] ?Arrival date & time: 01/07/22  0850 ? ?  ? ?History ? ?Chief Complaint  ?Patient presents with  ? Back Pain  ? ? ?Tabitha Robertson is a 56 y.o. female. ? ? ?Back Pain ?Associated symptoms: no chest pain, no numbness and no weakness   ?Patient was presents with back/pelvic pain.  Last night she was reaching into her car to get a glass ice.  States that she felt a crack in her back.  It was then more the midline of her pelvis.  History of right hip replacement that had been complicated by a dislocation.  States this feels different.  No loss of bladder or bowel control.  No fevers.  No relief with Celebrex and Robaxin.  Worse with sitting.  Worse with walking.  Worse with certain movements. ?  ?Past Surgical History:  ?Procedure Laterality Date  ? APPENDECTOMY    ? BILATERAL CARPAL TUNNEL RELEASE    ? BREAST LUMPECTOMY    ? CESAREAN SECTION    ? x 3  ? HIP CLOSED REDUCTION Right 04/26/2021  ? Procedure: CLOSED REDUCTION HIP;  Surgeon: Samson Frederic, MD;  Location: [facility] ORS;  Service: Orthopedics;  Laterality: Right;  ? SMALL INTESTINE SURGERY    ? TONSILLECTOMY    ? and adenoidectomy   ? TOTAL HIP ARTHROPLASTY Right 04/09/2021  ? Procedure: TOTAL HIP ARTHROPLASTY ANTERIOR APPROACH;  Surgeon: Durene Romans, MD;  Location: [facility] ORS;  Service: Orthopedics;  Laterality: Right;  ? ?Past Medical History:  ?Diagnosis Date  ? Anxiety   ? Arthritis   ? Fibromyalgia   ? GERD (gastroesophageal reflux disease)   ? History of hiatal hernia   ? Sleep apnea   ? does not use cpap not used in 10 years   ? ? ?Home Medications ?Prior to Admission medications   ?Medication Sig Start Date End Date Taking? Authorizing Provider  ?predniSONE (DELTASONE) 20 MG tablet Take 2 tablets (40 mg total) by mouth daily. 01/07/22  Yes Benjiman Core, MD  ?acetaminophen (TYLENOL) 650 MG CR tablet Take 1,300 mg by mouth every 8 (eight) hours as needed for pain.     [provider]  ?ALPHA LIPOIC ACID PO Take 1 capsule by mouth daily.    [provider]  ?amitriptyline (ELAVIL) 50 MG tablet Take 50 mg by mouth daily.    [provider]  ?Ascorbic Acid (VITAMIN C PO) Take 1 tablet by mouth daily.    [provider]  ?b complex vitamins capsule Take 1 capsule by mouth daily.    [provider]  ?celecoxib (CELEBREX) 200 MG capsule Take 1 capsule (200 mg total) by mouth 2 (two) times daily. 04/10/21   Cassandria Anger, PA-C  ?Cyanocobalamin (B-12 PO) Take 1 capsule by mouth daily.    [provider]  ?diclofenac Sodium (VOLTAREN) 1 % GEL Apply 1 application topically 4 (four) times daily as needed (pain).    [provider]  ?docusate sodium (COLACE) 100 MG capsule Take 1 capsule (100 mg total) by mouth 2 (two) times daily. 04/10/21   Cassandria Anger, PA-C  ?HYDROcodone-acetaminophen (NORCO/VICODIN) 5-325 MG tablet Take 1-2 tablets by mouth every 6 (six) hours as needed for severe pain. 04/10/21   Cassandria Anger, PA-C  ?LUTEIN PO Take 1 capsule by mouth daily.    [provider]  ?MAGNESIUM PO Take 1 tablet by mouth daily.    [provider]  ?  methocarbamol (ROBAXIN) 500 MG tablet Take 1 tablet (500 mg total) by mouth every 6 (six) hours as needed for muscle spasms. 04/10/21   Cassandria Angeronovan, Ashley R, PA-C  ?Multiple Vitamins-Minerals (ZINC PO) Take 1 tablet by mouth daily.    [provider]  ?polyethylene glycol (MIRALAX / GLYCOLAX) 17 g packet Take 17 g by mouth daily.    [provider]  ?POTASSIUM PO Take 1 tablet by mouth daily.    [provider]  ?TURMERIC PO Take 1 tablet by mouth daily.    [provider]  ?VITAMIN A PO Take 1 capsule by mouth daily.    [provider]  ?VITAMIN D PO Take 3 capsules by mouth daily.    [provider]  ?VITAMIN E PO Take 1 capsule by mouth daily.    [provider]  ?   ? ?Allergies    ?Patient has no  known allergies.   ? ?Review of Systems   ?Review of Systems  ?Cardiovascular:  Negative for chest pain.  ?Musculoskeletal:  Positive for back pain.  ?Neurological:  Negative for weakness and numbness.  ? ?Physical Exam ?Updated Vital Signs ?BP (!) 125/93   Pulse 72   Temp 97.6 ?F (36.4 ?C) (Oral)   Resp 18   Ht 5\' 5"  (1.651 m)   Wt 102.3 kg   SpO2 98%   BMI 37.53 kg/m?  ?Physical Exam ?Vitals and nursing note reviewed.  ?Eyes:  ?   Pupils: Pupils are equal, round, and reactive to light.  ?Cardiovascular:  ?   Rate and Rhythm: Regular rhythm.  ?Musculoskeletal:     ?   General: Tenderness present.  ?   Comments: Tenderness over upper sacrum.  No deformity.  May have some muscle spasm.  Uncomfortable with movements.  Sensation grossly intact in lower extremities.  ?Skin: ?   Capillary Refill: Capillary refill takes less than 2 seconds.  ?Neurological:  ?   Mental Status: She is alert and oriented to person, place, and time.  ? ? ?ED Results / Procedures / Treatments   ?Labs ?(all labs ordered are listed, but only abnormal results are displayed) ?Labs Reviewed - No data to display ? ?EKG ?None ? ?Radiology ?DG Pelvis 1-2 Views ? ?Result Date: 01/07/2022 ?CLINICAL DATA:  56 year old female with pain after "felt a pop". Low back and pelvic pain. EXAM: PELVIS - 1-2 VIEW COMPARISON:  04/26/2021 pelvis radiograph and earlier. FINDINGS: AP view at 0940 hours. Chronic right total hip arthroplasty appears stable and intact. Distal right femoral component not entirely included. Left femoral head normally located. No acute osseous abnormality identified. Negative lower abdominal and pelvic visceral contours. IMPRESSION: Chronic right hip arthroplasty. Stable radiographic appearance of the pelvis. Electronically Signed   By: Odessa FlemingH  Hall M.D.   On: 01/07/2022 09:48  ? ?DG Sacrum/Coccyx ? ?Result Date: 01/07/2022 ?CLINICAL DATA:  56 year old female with pain after "felt a pop". Low back and pelvic pain. EXAM: SACRUM AND COCCYX -  2+ VIEW COMPARISON:  Pelvis radiograph today and earlier. No prior pelvis CT. FINDINGS: Chronic right total hip arthroplasty is normally aligned. Background bone mineralization is within normal limits. Visible pelvis appears intact. SI joints are normal aside from some degenerative vacuum phenomena on the right. Sacral ala appear intact. On the lateral view the sacral and coccygeal segments are within normal limits. Mild to moderate lower lumbar facet hypertrophy. No acute osseous abnormality identified. Negative lower abdominal and pelvic visceral contours. IMPRESSION: No acute osseous abnormality identified.  Electronically Signed   By: Odessa Fleming M.D.   On: 01/07/2022 09:50   ? ?Procedures ?Procedures  ? ? ?Medications Ordered in ED ?Medications  ?ketorolac (TORADOL) 15 MG/ML injection 15 mg (15 mg Intramuscular Given 01/07/22 0940)  ?HYDROmorphone (DILAUDID) injection 0.5 mg (0.5 mg Intramuscular Given 01/07/22 0940)  ? ? ?ED Course/ Medical Decision Making/ A&P ?  ?                        ?Medical Decision Making ?Amount and/or Complexity of Data Reviewed ?Radiology: ordered. ? ?Risk ?Prescription drug management. ? ? ?Patient with pelvic pain posteriorly.  Began acutely after bending over to grab something from the car.  Does have a history of other chronic pain.  No relief with medicines as an outpatient.  Does have tenderness.  X-rays done and reassuring.  Feels better after symptomatic treatment.  Doubt severe fracture.  Doubt lumbar injury although potentially could be L5-S1 area.  X-ray reassuring.  No real red flags.  Will give short course of steroids to help.  Follow-up as an outpatient with her other providers.  Appears stable for discharge. ? ? ? ? ? ? ? ?Final Clinical Impression(s) / ED Diagnoses ?Final diagnoses:  ?Acute midline low back pain without sciatica  ? ? ?Rx / DC Orders ?ED Discharge Orders   ? ?      Ordered  ?  predniSONE (DELTASONE) 20 MG tablet  Daily       ? 01/07/22 1141  ? ?  ?  ? ?   ? ? ?  ?Benjiman Core, MD ?01/07/22 1618 ? ?

## 2022-01-07 NOTE — ED Triage Notes (Signed)
Pt reached in car last night to get a cup of ice, felt a pop in sacral area, difficult to sit and reports some swelling with pain in rt hip. Pt has taken meds without relief. ?

## 2022-01-07 NOTE — Discharge Instructions (Addendum)
Your x-rays were reassuring today.  Hopefully the steroids will help with some of the pain.  Other wise continue to take your pain medicines and muscle relaxers at home.  Follow-up with your doctors as needed ?

## 2022-06-07 ENCOUNTER — Emergency Department (HOSPITAL_COMMUNITY)
Admission: EM | Admit: 2022-06-07 | Discharge: 2022-06-07 | Disposition: A | Discharge status: Home / Self Care | Payer: BC Managed Care – PPO | Attending: Emergency Medicine | Admitting: Emergency Medicine

## 2022-06-07 ENCOUNTER — Encounter (HOSPITAL_COMMUNITY): Payer: Self-pay

## 2022-06-07 ENCOUNTER — Emergency Department (HOSPITAL_COMMUNITY): Payer: BC Managed Care – PPO

## 2022-06-07 ENCOUNTER — Other Ambulatory Visit: Payer: Self-pay

## 2022-06-07 DIAGNOSIS — N83209 Unspecified ovarian cyst, unspecified side: Secondary | ICD-10-CM

## 2022-06-07 DIAGNOSIS — M545 Low back pain, unspecified: Secondary | ICD-10-CM | POA: Diagnosis not present

## 2022-06-07 DIAGNOSIS — N939 Abnormal uterine and vaginal bleeding, unspecified: Secondary | ICD-10-CM | POA: Diagnosis present

## 2022-06-07 LAB — CBC WITH DIFFERENTIAL/PLATELET
Abs Immature Granulocytes: 0.02 10*3/uL (ref 0.00–0.07)
Basophils Absolute: 0.1 10*3/uL (ref 0.0–0.1)
Basophils Relative: 1 %
Eosinophils Absolute: 0.2 10*3/uL (ref 0.0–0.5)
Eosinophils Relative: 3 %
HCT: 41.6 % (ref 36.0–46.0)
Hemoglobin: 13.7 g/dL (ref 12.0–15.0)
Immature Granulocytes: 0 %
Lymphocytes Relative: 19 %
Lymphs Abs: 1.6 10*3/uL (ref 0.7–4.0)
MCH: 31.1 pg (ref 26.0–34.0)
MCHC: 32.9 g/dL (ref 30.0–36.0)
MCV: 94.5 fL (ref 80.0–100.0)
Monocytes Absolute: 0.6 10*3/uL (ref 0.1–1.0)
Monocytes Relative: 7 %
Neutro Abs: 5.8 10*3/uL (ref 1.7–7.7)
Neutrophils Relative %: 70 %
Platelets: 275 10*3/uL (ref 150–400)
RBC: 4.4 MIL/uL (ref 3.87–5.11)
RDW: 12.2 % (ref 11.5–15.5)
WBC: 8.4 10*3/uL (ref 4.0–10.5)
nRBC: 0 % (ref 0.0–0.2)

## 2022-06-07 LAB — COMPREHENSIVE METABOLIC PANEL
ALT: 12 U/L (ref 0–44)
AST: 17 U/L (ref 15–41)
Albumin: 3.9 g/dL (ref 3.5–5.0)
Alkaline Phosphatase: 154 U/L — ABNORMAL HIGH (ref 38–126)
Anion gap: 6 (ref 5–15)
BUN: 19 mg/dL (ref 6–20)
CO2: 24 mmol/L (ref 22–32)
Calcium: 8.7 mg/dL — ABNORMAL LOW (ref 8.9–10.3)
Chloride: 110 mmol/L (ref 98–111)
Creatinine, Ser: 1 mg/dL (ref 0.44–1.00)
GFR, Estimated: >60 mL/min (ref >60)
Glucose, Bld: 98 mg/dL (ref 70–99)
Potassium: 3.9 mmol/L (ref 3.5–5.1)
Sodium: 140 mmol/L (ref 135–145)
Total Bilirubin: 0.5 mg/dL (ref 0.3–1.2)
Total Protein: 7.1 g/dL (ref 6.5–8.1)

## 2022-06-07 LAB — I-STAT BETA HCG BLOOD, ED (MC, WL, AP ONLY): I-stat hCG, quantitative: <5 m[IU]/mL (ref <5)

## 2022-06-07 LAB — LIPASE, BLOOD: Lipase: 33 U/L (ref 11–51)

## 2022-06-07 NOTE — ED Provider Notes (Signed)
[facility] DEPT Provider Note   CSN: [id number] Arrival date & time: 06/07/22  1548     History {Add pertinent medical, surgical, social history, OB history to HPI:1} Chief Complaint  Patient presents with   Back Pain   Vaginal Bleeding    Tabitha Robertson is a 56 y.o. female presenting today with 5 weeks of suprapubic tenderness and vaginal bleeding with right-sided back pain.  Vaginal bleeding started 5 weeks ago, subsided after a few days.  Started again 3 weeks ago, subsided after a few days.  And then started again this morning, which concerned her.  No loss of bladder or bowel control.  No recent injury/trauma.  No saddle anesthesia.  Denies fevers, chills, leg weakness, radiculopathy symptoms, lightheadedness, or shortness of breath.  Pain worsened with walking, twisting motions, and pressure.  Was seeing an OB/GYN in [place] for abnormal uterine bleeding and was diagnosed with fibroids and mildly thickened endometrium.  They attempted to gather an endometrial sample twice, without success.  Patient stopped returning to them, does not have a current OB/GYN.  Appears to have been lost to follow up.  The history is provided by the patient and medical records.  Back Pain Vaginal Bleeding Associated symptoms: back pain      Home Medications Prior to Admission medications   Medication Sig Start Date End Date Taking? Authorizing Provider  acetaminophen (TYLENOL) 650 MG CR tablet Take 1,300 mg by mouth every 8 (eight) hours as needed for pain.    [provider]  ALPHA LIPOIC ACID PO Take 1 capsule by mouth daily.    [provider]  amitriptyline (ELAVIL) 50 MG tablet Take 50 mg by mouth daily.    [provider]  Ascorbic Acid (VITAMIN C PO) Take 1 tablet by mouth daily.    [provider]  b complex vitamins capsule Take 1 capsule by mouth daily.    [provider]  celecoxib (CELEBREX) 200 MG capsule  Take 1 capsule (200 mg total) by mouth 2 (two) times daily. 04/10/21   Irving Copas, PA-C  Cyanocobalamin (B-12 PO) Take 1 capsule by mouth daily.    [provider]  diclofenac Sodium (VOLTAREN) 1 % GEL Apply 1 application topically 4 (four) times daily as needed (pain).    [provider]  docusate sodium (COLACE) 100 MG capsule Take 1 capsule (100 mg total) by mouth 2 (two) times daily. 04/10/21   Irving Copas, PA-C  HYDROcodone-acetaminophen (NORCO/VICODIN) 5-325 MG tablet Take 1-2 tablets by mouth every 6 (six) hours as needed for severe pain. 04/10/21   Irving Copas, PA-C  LUTEIN PO Take 1 capsule by mouth daily.    [provider]  MAGNESIUM PO Take 1 tablet by mouth daily.    [provider]  methocarbamol (ROBAXIN) 500 MG tablet Take 1 tablet (500 mg total) by mouth every 6 (six) hours as needed for muscle spasms. 04/10/21   Irving Copas, PA-C  Multiple Vitamins-Minerals (ZINC PO) Take 1 tablet by mouth daily.    [provider]  polyethylene glycol (MIRALAX / GLYCOLAX) 17 g packet Take 17 g by mouth daily.    [provider]  POTASSIUM PO Take 1 tablet by mouth daily.    [provider]  predniSONE (DELTASONE) 20 MG tablet Take 2 tablets (40 mg total) by mouth daily. 01/07/22   Davonna Belling, MD  TURMERIC PO Take 1 tablet by mouth daily.    [provider]  VITAMIN A PO Take 1 capsule by mouth daily.    [provider]  VITAMIN D PO Take 3 capsules by mouth daily.    [provider]  VITAMIN E PO Take 1 capsule by mouth daily.    [provider]      Allergies    Patient has no known allergies.    Review of Systems   Review of Systems  Genitourinary:  Positive for vaginal bleeding.  Musculoskeletal:  Positive for back pain.    Physical Exam Updated Vital Signs BP (!) 145/105   Pulse 81   Temp 98.3 F (36.8 C) (Oral)   Resp 20   Ht 5' 5" (1.651 m)   Wt  106.6 kg   SpO2 98%   BMI 39.11 kg/m  Physical Exam Vitals and nursing note reviewed. Exam conducted with a chaperone present.  Constitutional:      General: She is not in acute distress.    Appearance: Normal appearance. She is well-developed. She is not ill-appearing, toxic-appearing or diaphoretic.  HENT:     Head: Normocephalic and atraumatic.     Mouth/Throat:     Mouth: Mucous membranes are moist.     Dentition: Abnormal dentition.     Pharynx: Oropharynx is clear.  Eyes:     Conjunctiva/sclera: Conjunctivae normal.  Neck:     Comments: No meningismus or torticollis Cardiovascular:     Rate and Rhythm: Normal rate and regular rhythm.     Pulses: Normal pulses.     Heart sounds: Normal heart sounds. No murmur heard. Pulmonary:     Effort: Pulmonary effort is normal. No respiratory distress.     Breath sounds: Normal breath sounds.  Chest:     Chest wall: No tenderness.  Abdominal:     Palpations: Abdomen is soft.     Tenderness: There is abdominal tenderness (Mild) in the suprapubic area.  Genitourinary:    General: Normal vulva.     Exam position: Lithotomy position.     Labia:        Right: No rash, tenderness or lesion.        Left: No rash, tenderness or lesion.      Vagina: No foreign body. No vaginal discharge or tenderness.     Cervix: Cervical bleeding (Minimal) present.     Uterus: Tender.      Adnexa:        Right: Tenderness present. No mass.         Left: Tenderness present. No mass.       Comments: Right adnexa more tender than left.  Minimal blood visualized from cervix Musculoskeletal:        General: No swelling.     Cervical back: Neck supple. No rigidity.     Lumbar back: Tenderness present. No bony tenderness. Negative right straight leg raise test and negative left straight leg raise test.       Back:     Comments: Tenderness as depicted above.  No obvious mass, ecchymosis, rash, erythema, warmth, or deformity.  Skin:    General: Skin is  warm and dry.     Capillary Refill: Capillary refill takes less than 2 seconds.  Neurological:     Mental Status: She is alert and oriented to person, place, and time.     Sensory: No sensory deficit.     Motor: No weakness.     Coordination: Coordination normal.     Deep Tendon Reflexes: Reflexes normal.  Psychiatric:        Mood and Affect: Mood is anxious.     ED Results / Procedures / Treatments   Labs (all labs ordered are listed, but only abnormal results are displayed) Labs Reviewed  COMPREHENSIVE METABOLIC PANEL - Abnormal; Notable for the following components:      Result Value   Calcium 8.7 (*)    Alkaline Phosphatase 154 (*)    All other components within normal limits  CBC WITH DIFFERENTIAL/PLATELET  LIPASE, BLOOD  I-STAT BETA HCG BLOOD, ED ([facility], [facility], [facility] ONLY)    EKG None  Radiology [place] Art/Ven Flow Abd Pelv Doppler  Result Date: 06/07/2022 CLINICAL DATA:  Vaginal bleeding and back pain. EXAM: TRANSABDOMINAL AND TRANSVAGINAL ULTRASOUND OF PELVIS DOPPLER ULTRASOUND OF OVARIES TECHNIQUE: Both transabdominal and transvaginal ultrasound examinations of the pelvis were performed. Transabdominal technique was performed for global imaging of the pelvis including uterus, ovaries, adnexal regions, and pelvic cul-de-sac. It was necessary to proceed with endovaginal exam following the transabdominal exam to visualize the uterus, endometrium, bilateral ovaries and bilateral adnexa. Color and duplex Doppler ultrasound was utilized to evaluate blood flow to the ovaries. COMPARISON:  None Available. FINDINGS: Uterus Measurements: 7.7 cm x 3.7 cm x 5.5 cm = volume: 83.1 mL. No fibroids or other mass visualized. Endometrium Thickness: 5.0 mm.  No focal abnormality visualized. Right ovary Measurements: 2.8 cm x 1.9 cm x 2.3 cm = volume: 6.5 mL. Normal appearance/no adnexal mass. Left ovary Measurements: 2.2 cm x 1.9 cm x 2.5 cm = volume: 5.7 mL. A 1.5 cm x 1.2 cm x 1.7 cm cyst is seen within  the left ovary. No abnormal flow is noted within this region on color Doppler evaluation. Pulsed Doppler evaluation of both ovaries demonstrates normal low-resistance arterial and venous waveforms. Other findings No abnormal free fluid. IMPRESSION: Simple, benign left ovarian cyst. No additional follow-up imaging is recommended. This recommendation follows the consensus statement: Management of Asymptomatic Ovarian and Other Adnexal Cysts Imaged at [place]: Society of Radiologists in [place]. Radiology 2010; [phone]. Electronically Signed   By: Virgina Norfolk M.D.   On: 06/07/2022 19:10    Procedures Procedures  {Document cardiac monitor, telemetry assessment procedure when appropriate:1}  Medications Ordered in ED Medications - No data to display  ED Course/ Medical Decision Making/ A&P                           Medical Decision Making Amount and/or Complexity of Data Reviewed Labs: ordered. Radiology: ordered.   56 y.o. female presents to the ED for concern of Back Pain and Vaginal Bleeding   This involves an extensive number of treatment options, and is a complaint that carries with it a high risk of complications and morbidity.  The emergent differential diagnosis prior to evaluation includes, but is not limited to: Leiomyoma, ovarian torsion, ovarian cyst rupture, endometrioma  This is not an exhaustive differential.   Past Medical History / Co-morbidities / Social History: Hx of GERD, anxiety, fibromyalgia, sleep apnea, hiatal hernia.  Surgical Hx includes prior C-section and appendectomy Social Determinants of Health include: None  Additional History:  Obtained by chart review.  Notably Hx of right THA.  Also per OB/GYN October-December 2022, seen for abnormal uterine bleeding, with history of irregular menses.  Attempted endometrial ablation, however 2 attempts were unsuccessful.  "[place]...showed 8cm uterus with 2 submucosal fibroids. Endometrial  stripe was reported at 3.7m".  Were considering possible  myomectomy if bleeding persisted. Also Sept 2022 PCP Pelvic US results: "...shows uterine fibroids, normal appearance of ovaries."  Lab Tests: I ordered, and personally interpreted labs.  The pertinent results include:   H&H stable 13.7/41.6 WBC 8.4 without leukocytosis I-STAT beta-hCG negative Lipase Alk phos 154  Imaging Studies: I ordered imaging studies including Pelvic US series -- Pending  ED Course: Pt well-appearing on exam.  ***.    Patient in NAD and in good condition at time of discharge.  Disposition: 1915 care of Jakelyn Squyres transferred to PA Joanette Gula and at the end of my shift as the patient will require reassessment once labs/imaging have resulted.  Patient presentation, ED course, and plan of care discussed with review of all pertinent labs and imaging.  Please see his/her note for further details regarding further ED course and disposition.  Plan at time of handoff is likely dependent on image findings, though anticipate discharge with close OBGYN follow up.  This may be altered or completely changed at the discretion of the oncoming team pending results of further workup.  This chart was dictated using voice recognition software.  Despite best efforts to proofread, errors can occur which can change the documentation meaning.   {Document critical care time when appropriate:1} {Document review of labs and clinical decision tools ie heart score, Chads2Vasc2 etc:1}  {Document your independent review of radiology images, and any outside records:1} {Document your discussion with family members, caretakers, and with consultants:1} {Document social determinants of health affecting pt's care:1} {Document your decision making why or why not admission, treatments were needed:1} Final Clinical Impression(s) / ED Diagnoses Final diagnoses:  Abnormal uterine bleeding    Rx / DC Orders ED Discharge Orders      None

## 2022-06-07 NOTE — ED Provider Notes (Signed)
Accepted handoff at shift change from [facility]. Please see prior provider note for more detail.   Briefly: Patient is 56 y.o.   DDX: concern for vaginitis, malignancy, uterine fibroids  Plan: Follow-up on urinalysis for possible UTI.  Follow-up on pelvic ultrasound and potential consult OB/GYN.      RISR  EDTHIS  Physical Exam  BP (!) 145/105   Pulse 81   Temp 98.3 F (36.8 C) (Oral)   Resp 20   Ht 5\' 5"  (1.651 m)   Wt 106.6 kg   SpO2 98%   BMI 39.11 kg/m   Physical Exam  Procedures  Procedures  ED Course / MDM    Medical Decision Making Amount and/or Complexity of Data Reviewed Labs: ordered. Radiology: ordered.         , PA-C 06/07/22 1926    08/07/22, MD 06/07/22 2256

## 2022-06-07 NOTE — Discharge Instructions (Addendum)
Diagnosed with abnormal uterine bleeding and ovarian cyst.  Overall work-up was reassuring.  Pelvic ultrasound was unremarkable but did reveal a simple ovarian cyst.  Also encouraging that your labs are nonsuggestive of acute blood loss and that your vitals are normal.  Would advise that you follow-up OB/GYN for ongoing abnormal vaginal bleeding.  If your bleeding worsens any start to soak your pads, experience new shortness of breath or lightheadedness please return to the emergency department for evaluation.  In your discharge instructions is a contact information for local OB/GYN.  Recommend that you schedule appointment at your earliest convenience.

## 2022-06-07 NOTE — ED Triage Notes (Signed)
"  Third menstrual cycle in a month and my lower right back is cramping so bad" per pt

## 2022-09-07 IMAGING — DX DG PORTABLE PELVIS
1 series · 1 of 1 positions shown · non-contrast
Comparison: Radiograph earlier today

CLINICAL DATA: Post right hip closed reduction.

EXAM:
PORTABLE PELVIS 1-2 VIEWS

[pelvis ap]
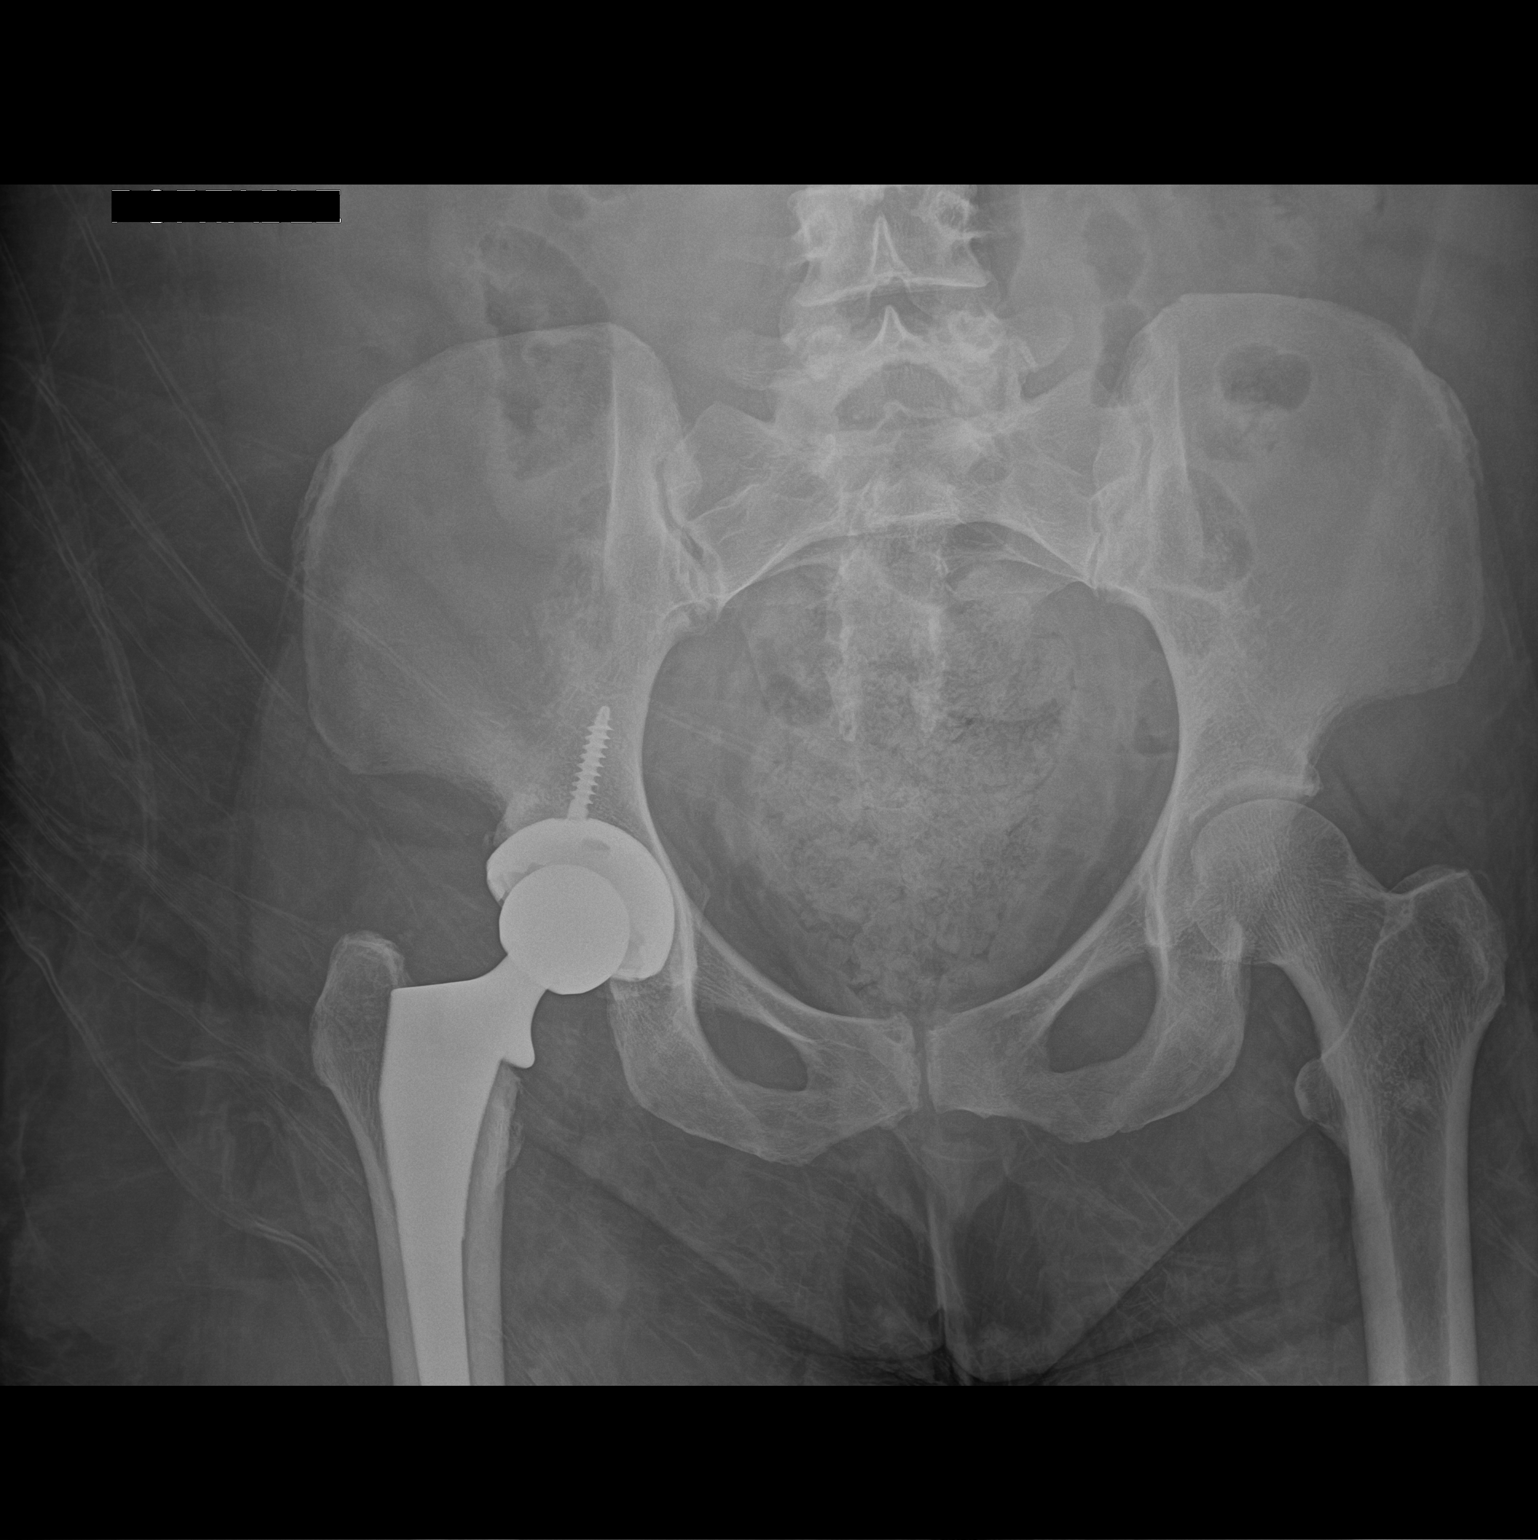

[1 of 1 positions shown; findings below may reference images not displayed]

FINDINGS: The previous dislocation of the femoral head component has been
reduced, femoral head component no normally aligned with the
acetabular component. No visualized fracture. The remainder of the
bony pelvis is intact.
IMPRESSION: Reduction of previous dislocated femoral head component of right hip
arthroplasty.

## 2023-05-21 IMAGING — CR DG SACRUM/COCCYX 2+V
4 series · 4 of 4 positions shown · non-contrast
Comparison: Pelvis radiograph today and earlier. No prior pelvis
CT.

CLINICAL DATA: 55-year-old female with pain after "felt a pop". Low
back and pelvic pain.

EXAM:
SACRUM AND COCCYX - 2+ VIEW

[t sacrum ap]
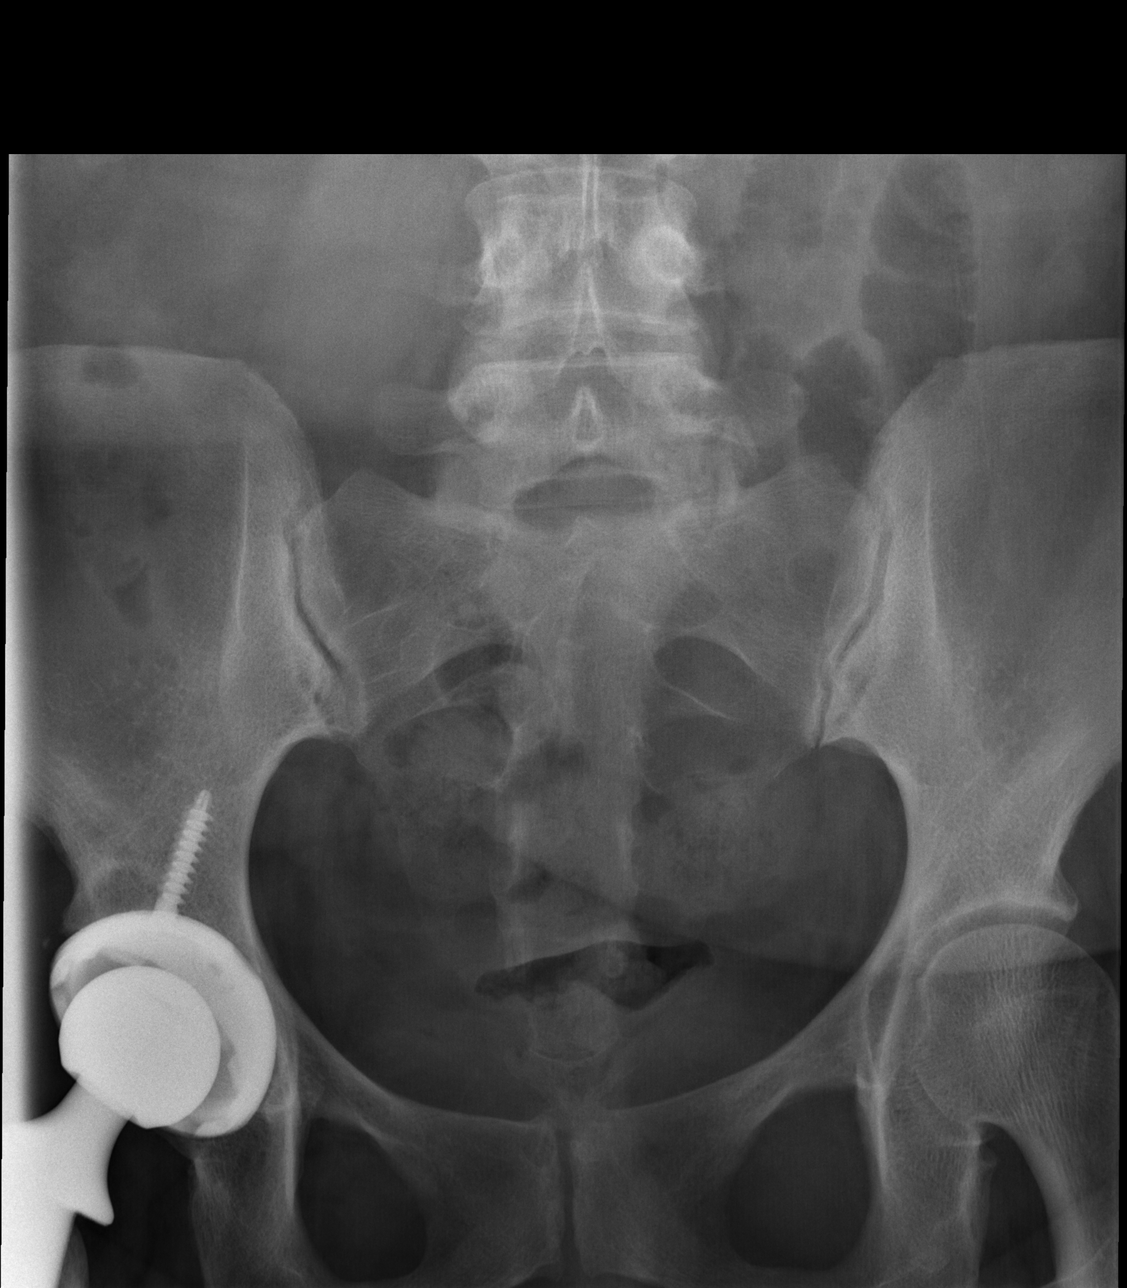

[t coccyx ap]
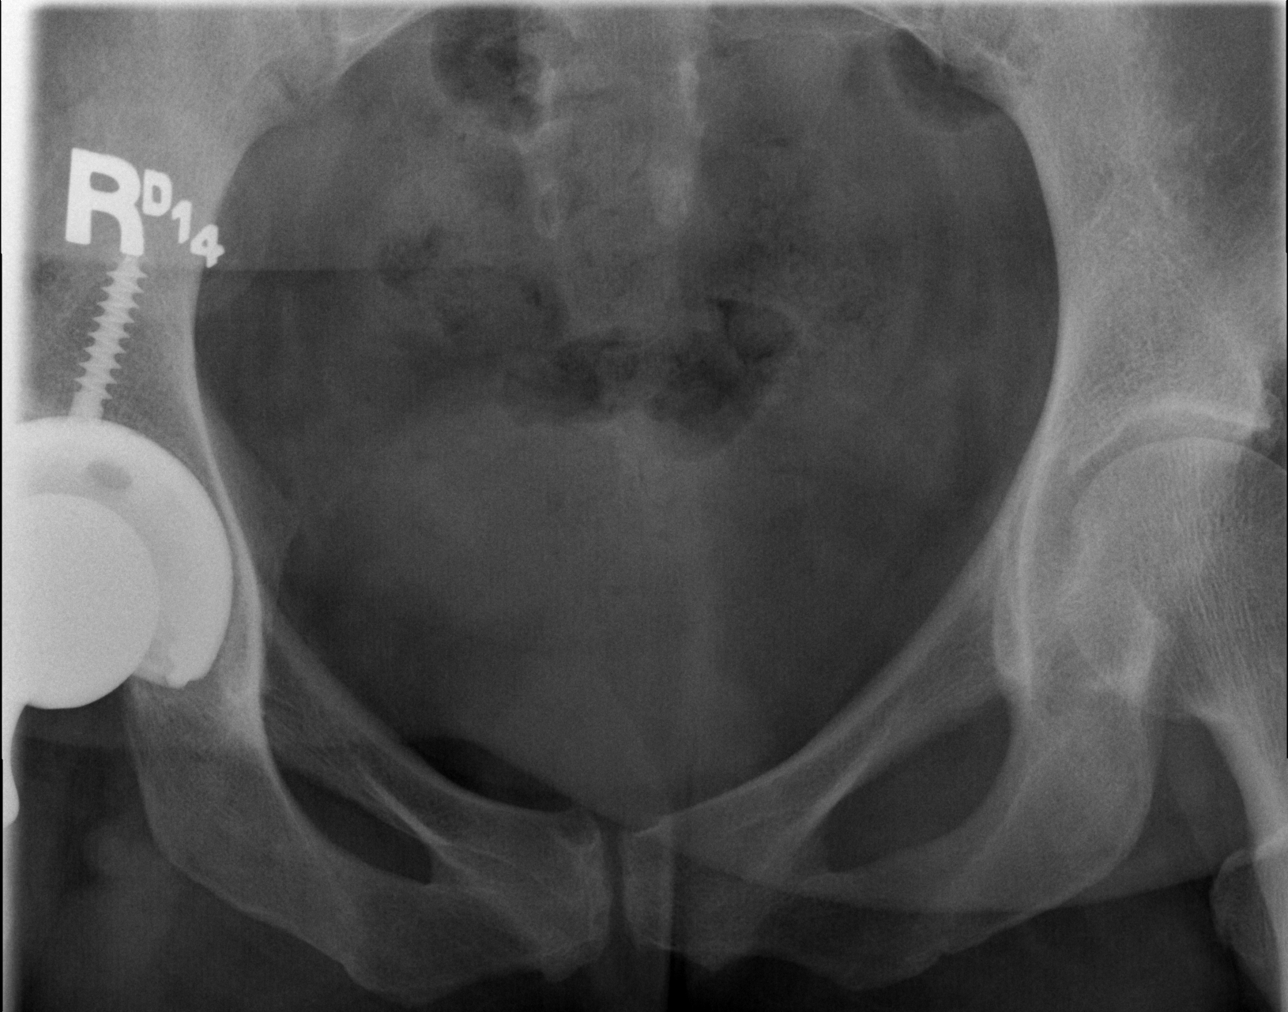

[t sacrum coccyx lat (1 of 2)]
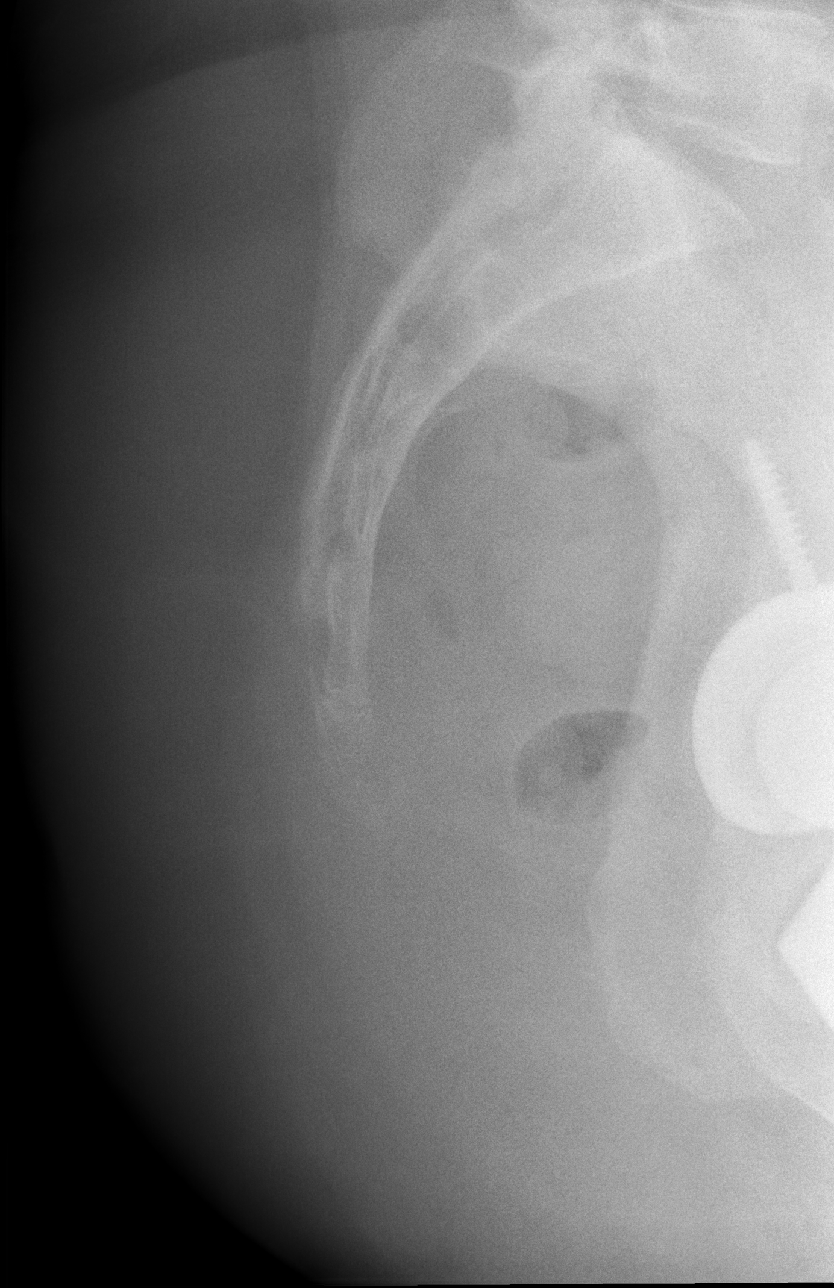

[t sacrum coccyx lat (2 of 2)]
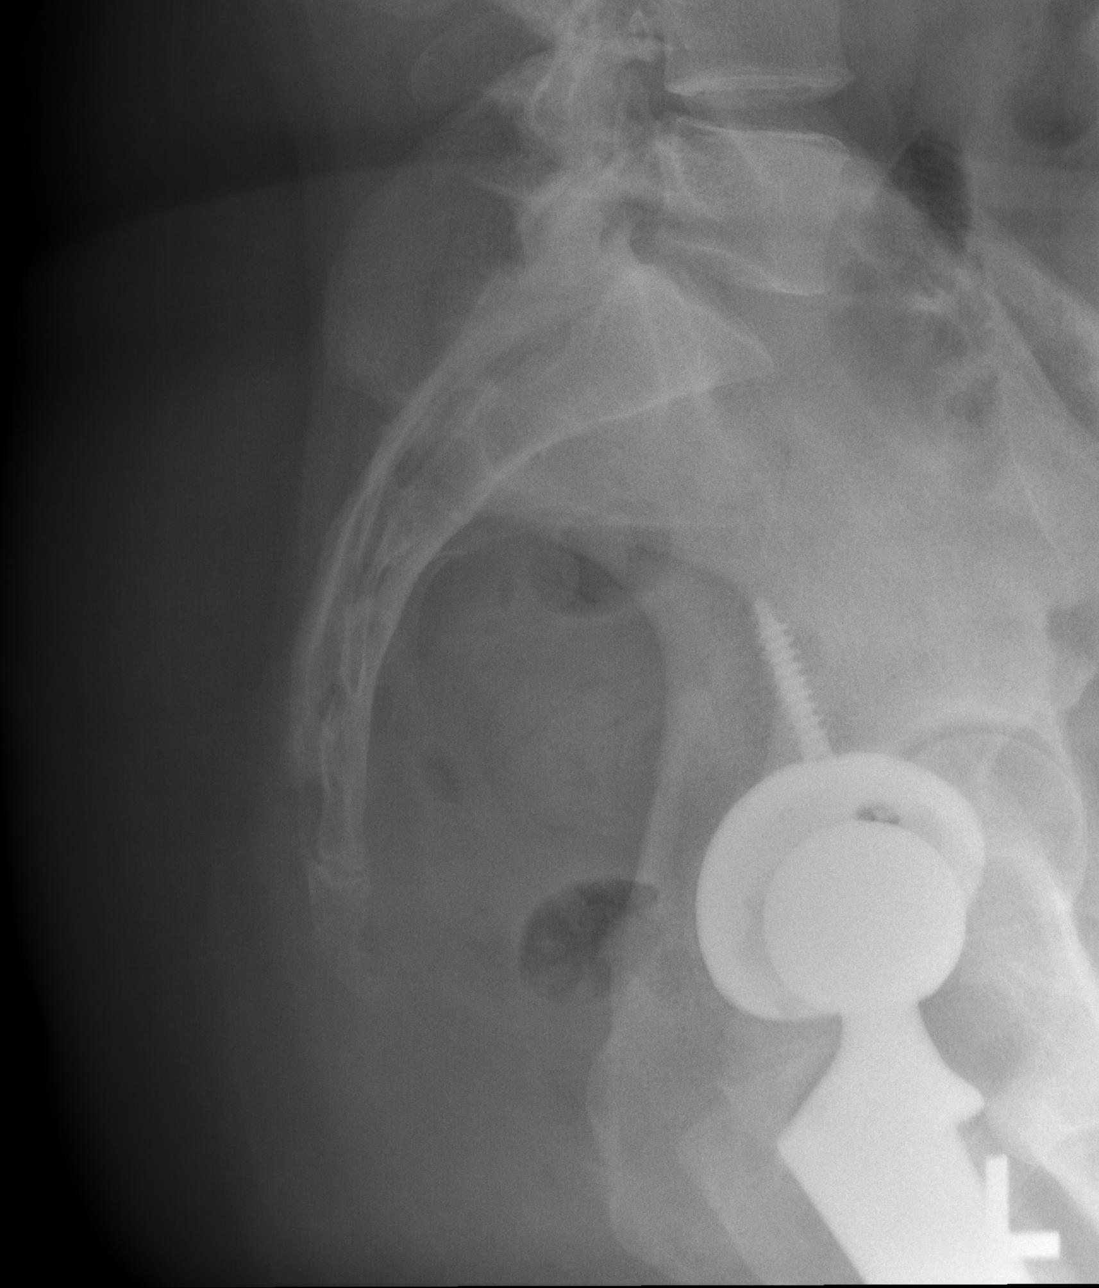

[4 of 4 positions shown; findings below may reference images not displayed]

FINDINGS: Chronic right total hip arthroplasty is normally aligned. Background
bone mineralization is within normal limits. Visible pelvis appears
intact. SI joints are normal aside from some degenerative vacuum
phenomena on the right. Sacral ala appear intact. On the lateral
view the sacral and coccygeal segments are within normal limits.
Mild to moderate lower lumbar facet hypertrophy. No acute osseous
abnormality identified. Negative lower abdominal and pelvic visceral
contours.
IMPRESSION: No acute osseous abnormality identified.
